# Patient Record
Sex: Female | Born: 1960 | Race: White | Hispanic: No | Marital: Married | Smoking: Never smoker
Health system: Southern US, Community
[De-identification: ages and names within clinical notes are randomized; demographics above are authoritative.]

## PROBLEM LIST (undated history)

## (undated) DIAGNOSIS — E079 Disorder of thyroid, unspecified: Secondary | ICD-10-CM

---

## 2001-09-10 ENCOUNTER — Inpatient Hospital Stay (HOSPITAL_COMMUNITY): Admission: AD | Admit: 2001-09-10 | Discharge: 2001-09-14 | Payer: Self-pay | Admitting: Obstetrics and Gynecology

## 2001-10-13 ENCOUNTER — Other Ambulatory Visit: Admission: RE | Admit: 2001-10-13 | Discharge: 2001-10-13 | Payer: Self-pay | Admitting: Obstetrics and Gynecology

## 2004-01-30 ENCOUNTER — Other Ambulatory Visit: Admission: RE | Admit: 2004-01-30 | Discharge: 2004-01-30 | Payer: Self-pay | Admitting: Obstetrics and Gynecology

## 2007-07-16 ENCOUNTER — Emergency Department (HOSPITAL_COMMUNITY): Admission: EM | Admit: 2007-07-16 | Discharge: 2007-07-16 | Payer: Self-pay | Admitting: Emergency Medicine

## 2009-09-12 ENCOUNTER — Other Ambulatory Visit: Admission: RE | Admit: 2009-09-12 | Discharge: 2009-09-12 | Payer: Self-pay | Admitting: Family Medicine

## 2010-05-09 ENCOUNTER — Ambulatory Visit (HOSPITAL_COMMUNITY): Admission: RE | Admit: 2010-05-09 | Discharge: 2010-05-09 | Payer: Self-pay | Admitting: Internal Medicine

## 2010-05-09 IMAGING — US US SOFT TISSUE HEAD/NECK
1 series · 14 of 25 positions shown · non-contrast
Comparison: None.

CLINICAL DATA: Chronic lymphocytic thyroiditis

THYROID ULTRASOUND
TECHNIQUE: Ultrasound examination of the thyroid gland and adjacent
soft tissues was performed.

[Series 1: us soft tissue head/neck · 0.08mm/px · 14 of 29 slices shown]
[im 1/29]
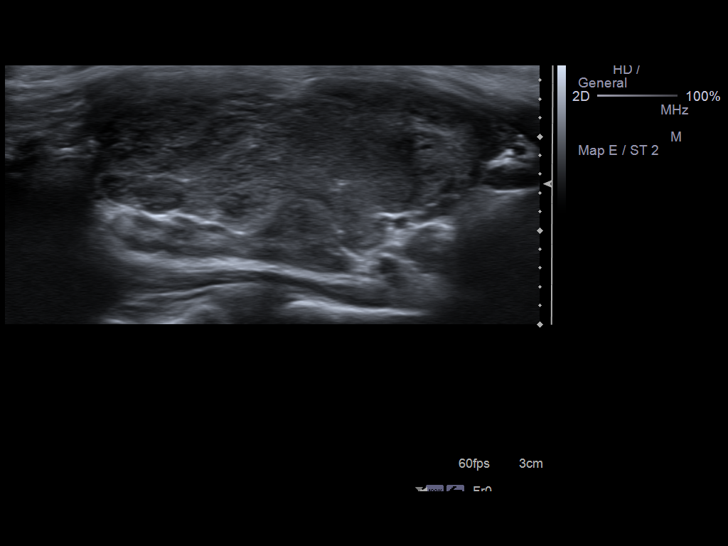
[im 3/29]
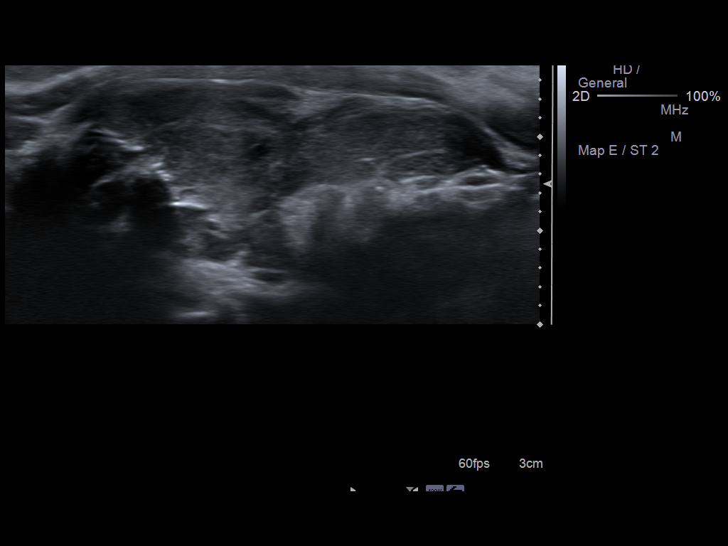
[im 5/29]
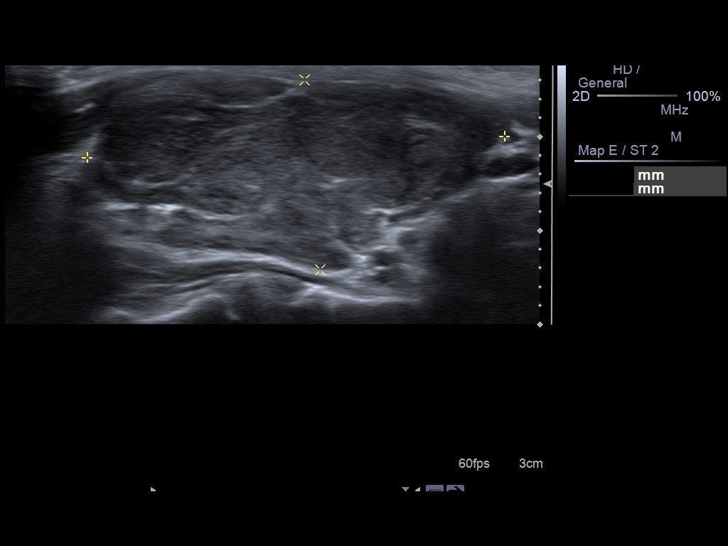
[im 8/29]
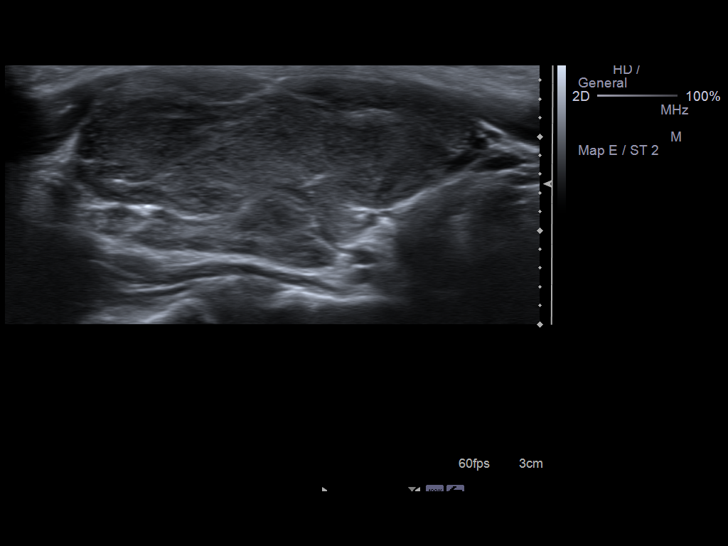
[im 10/29]
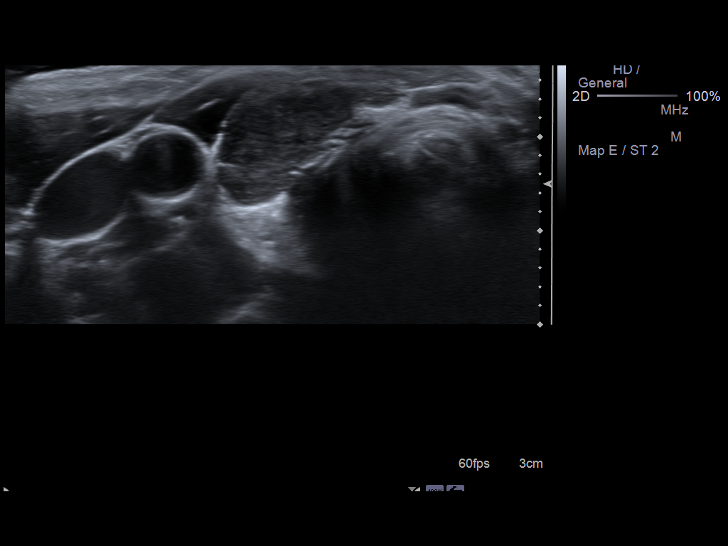
[im 11/29]
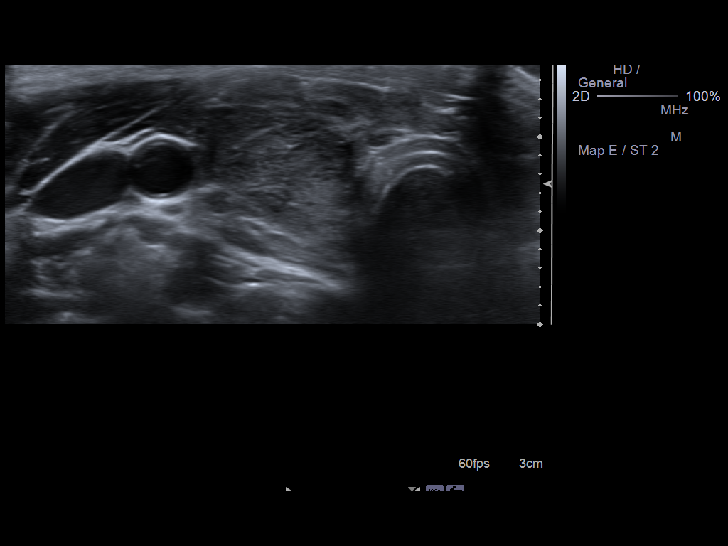
[im 13/29]
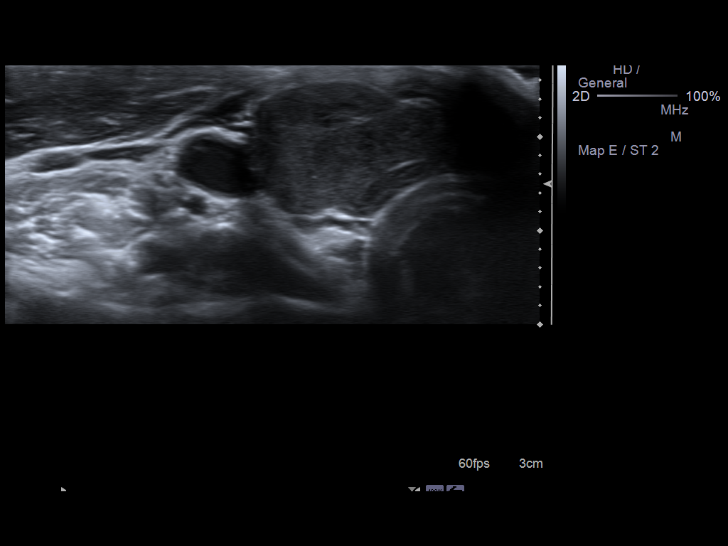
[im 16/29]
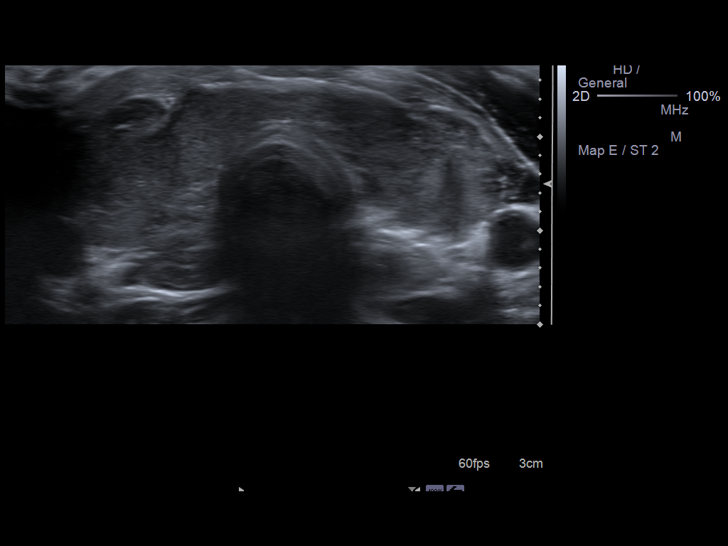
[im 18/29]
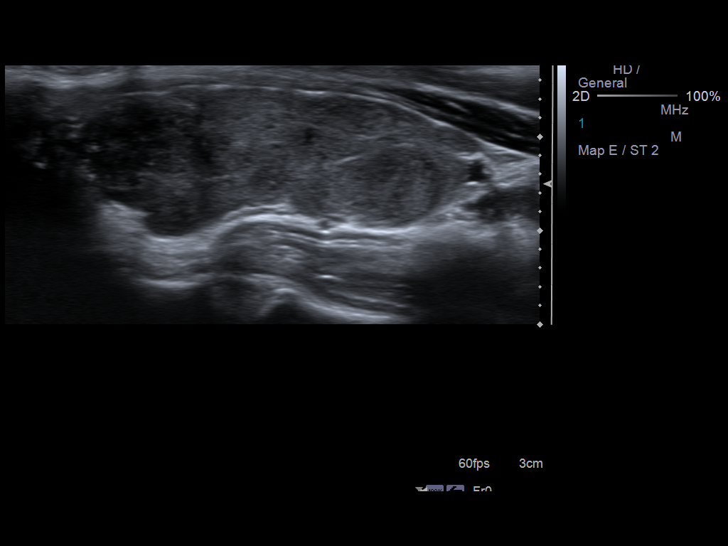
[im 19/29]
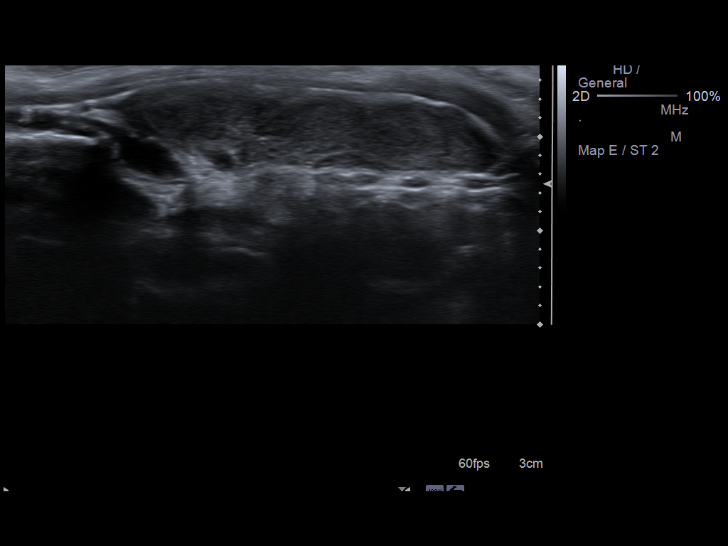
[im 22/29]
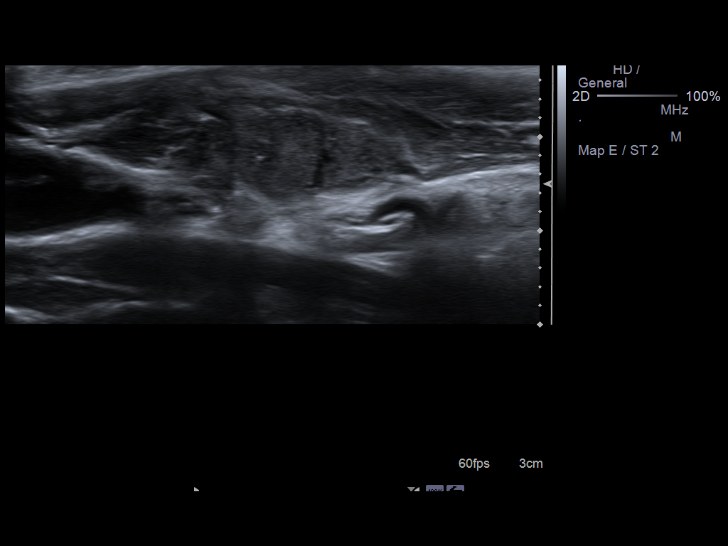
[im 24/29]
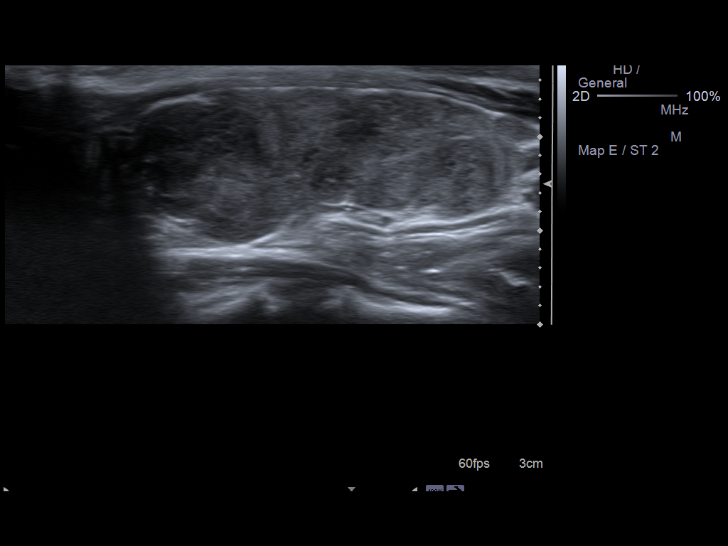
[im 26/29]
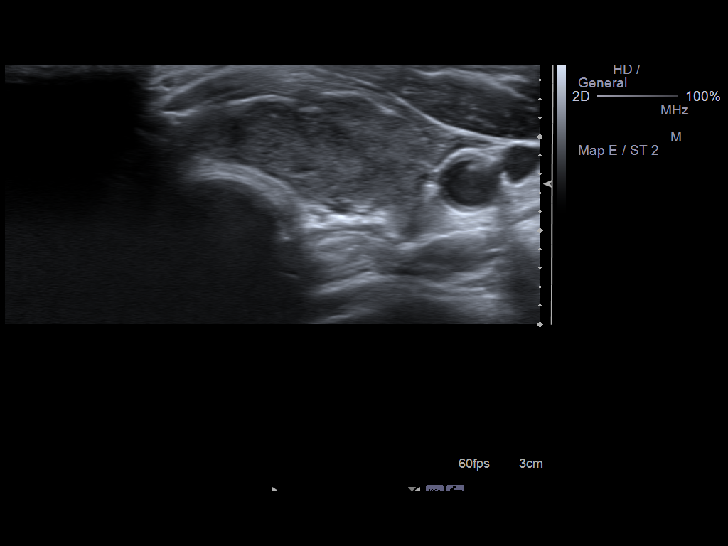
[im 29/29]
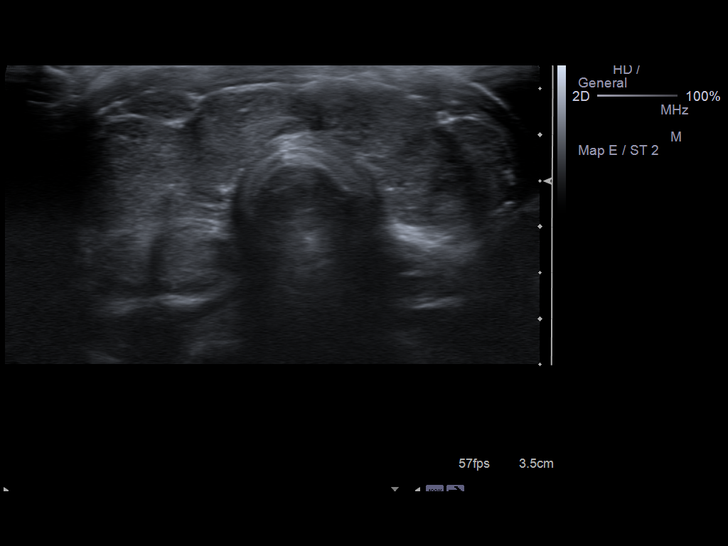

[14 of 25 positions shown; findings below may reference images not displayed]

FINDINGS: Right thyroid lobe:  19 x 20 x 45 mm, lobular and inhomogeneous in
echotexture without dominant lesion or nodule.
Left thyroid lobe:  15 x 17 x 42 mm, similarly lobular and
inhomogeneous in echotexture without discrete nodule or mass.
Isthmus:  4 mm thickness, unremarkable

Focal nodules:  No dominant nodule or mass

Lymphadenopathy:  None identified
IMPRESSION: Inhomogeneous thyroid echotexture without discrete mass or nodule.

## 2011-02-15 NOTE — Op Note (Signed)
[facility]  Patient:    [name], [name] Visit Number: [id number] MRN: [id number]          Service Type: OBS Location: 910B [id number] 01 Attending Physician:  Genia Del Dictated by:   Silverio Lay, M.D. Proc. Date: 09/11/01 Admit Date:  09/10/2001                             Operative Report  PREOPERATIVE DIAGNOSES:       Intrauterine pregnancy at 41 weeks, failure to progress.  POSTOPERATIVE DIAGNOSES:      Intrauterine pregnancy at 41 weeks, failure to progress.  ANESTHESIA:                   Epidural.  PROCEDURE:                    Primary low transverse cesarean section.  SURGEON:                      Silverio Lay, M.D.  ASSISTANT:                    ______  PROCEDURE:                    After being informed of the planned procedure with possible complications including bleeding, infection, injury to bowels, bladder, and ureters informed consent was obtained.  Patient was taken to OR #1 and preexisting epidural anesthesia was reinforced.  She was placed in a dorsal decubitus position with pelvis tilted to the left, prepped and draped in a sterile fashion and a Foley catheter was already in her bladder.  After assessing adequate level of anesthesia we infiltrate skin and subcutaneous tissue using 10 cc of Marcaine 0.25.  We then proceed with incision of skin, subcutaneous tissue, and fat in a low transverse fashion.  Fascia is incised in a low transverse fashion.  ______ is dissected and peritoneum is entered in the midline fashion.  Visceroperitoneum is entered in the low transverse fashion allowing [place] to develop bladder flap and to safely retract bladder. Myometrium is then entered in a low transverse fashion first with ______ bluntly.  Amniotic fluid is clear.  We assist the birth of a female infant at 9:18 p.m. in a left transverse position with a shoulder cord loop.  Mouth and nose are suctioned.  Cord is clamped with two  Kelly clamps and sectioned and baby is given to the pediatrician present in the room.  We then draw an arterial cord blood gas as well as 20 cc of venous blood.  Placenta is expelled spontaneously.  Uterine revision is negative.  We then proceed with closure of myometrium in two layers, first with a running locked suture of 0 Vicryl, then with a Lembert suture of 0 Vicryl covering the first one. Hemostasis is assessed and adequate.  Both pericolic gutters are cleansed. Both tubes and ovaries assessed and normal.  Hemostasis is reassessed and adequate.  Under fascia hemostasis is completed with cautery and fascia is closed with two running sutures of 0 Vicryl meeting midline.  Wound is then irrigated with warm saline.  Hemostasis is completed with cautery and skin is closed with staples.  Instruments and sponge count is complete x 2.  Estimated blood loss is 600 cc.  Baby received an Apgar of 8 at one minute and 9 at five minutes.  Weight 7  pounds 8 ounces.  Cord pH was 7.26.  Procedure is well tolerated by the patient who is taken to recovery room in a well and stable condition. Dictated by:   Silverio Lay, M.D. Attending Physician:  Genia Del DD:  09/11/01 TD:  09/12/01 Job: [id number]

## 2011-02-15 NOTE — H&P (Signed)
[facility] of [facility]  Patient:    [name], [name] Visit Number: [id number] MRN: [id number]          Service Type: Attending:  Silverio Lay, M.D. Dictated by:   Silverio Lay, M.D. Adm. Date:  09/10/01                           History and Physical  DATE OF BIRTH:                1961-01-19  Patient will be admitted tonight on September 10, 2001 to undergo cervical ripening.  REASON FOR ADMISSION:         Intrauterine pregnancy at 40 weeks and 6 days, elective induction of labor for postdates.  HISTORY OF PRESENT ILLNESS:   This is a 50 year old married white female, gravida 3, para 0, abortus 2, with a due date by ultrasound of September 04, 2001, who is being admitted today for elective induction of labor for postdates and is going to undergo cervical ripening with Cytotec.  She was last seen in the office on September 08, 2001, reporting good fetal activity, denying any contractions, denying any bleeding or watery discharge and denying any symptoms of pregnancy-induced hypertension.  Her ultrasound that day revealed an estimated fetal weight of 7 pound 11 ounces, or 31st percentile, amniotic fluid index at 13.1, or 69th percentile, and biophysical profile 8/8. Her vaginal exam was a right 1 cm, 60% effaced, vertex -2.  Her prenatal course reveals blood type O-positive, toxoplasmosis negative, RPR nonreactive, rubella immune, HBsAg negative, HIV nonreactive, hepatitis C antibody negative, Pap smear within normal limits, gonorrhea negative, Chlamydia negative.  First trimester ultrasound changed due date to September 04, 2001, with a normal nuchal translucency.  Amniocentesis was performed under ultrasound guidance at 16 weeks and revealed a normal karyotype; patient does not want to know sex of the baby.  Twenty-week ultrasound revealed a normal anatomy survey, posterior placenta, normal cervical length.  One-hour glucose tolerance test at 28 weeks  was 98, well within normal limits.  Thirty-five-week group B strep was negative.  Her prenatal course was otherwise complicated by refractory carpal tunnel symptoms, which patient has been using splints bilaterally.  ALLERGIES:                    No known drug allergy.  PAST MEDICAL HISTORY:         TAB at 10 weeks in 1984, no complication; SAB at 10 weeks in 1986, no complication.  FAMILY HISTORY:               Father with diabetes and myocardial infarction as well as stroke.  SOCIAL HISTORY:               Married, nonsmoker, is an Chief of Staff.  PHYSICAL EXAMINATION:  GENERAL:                      Current weight is 139 pounds for a 30-pound weight gain.  VITAL SIGNS:                  Blood pressure 90/64.  HEENT:                        Negative.  LUNGS:                        Clear.  HEART:  Normal.  ABDOMEN:                      Gravid, nontender, fundal height at 38 cm, vertex presentation.  EXTREMITIES:                  Negative.  ASSESSMENT:                   Intrauterine pregnancy at 40 weeks and 6 days, to undergo elective induction of labor.  PLAN:                         Patient is admitted on September 10, 2001 to undergo cervical ripening with Cytotec and induction with Pitocin augmentation the next morning.  Spontaneous vaginal delivery is expected.  Babys name will be [name] or [name], depending on gender. Dictated by:   Silverio Lay, M.D. Attending:  Silverio Lay, M.D. DD:  09/10/01 TD:  09/10/01 Job: [id number]

## 2011-02-15 NOTE — Discharge Summary (Signed)
[facility] of [facility]  Patient:    [name], [name] Visit Number: [id number] MRN: [id number]          Service Type: OBS Location: 910A 9131 01 Attending Physician:  Genia Del Dictated by:   Silverio Lay, M.D. Admit Date:  09/10/2001 Discharge Date: 09/14/2001                             Discharge Summary  REASON FOR ADMISSION:         Intrauterine pregnancy at 40-6/7 weeks, to undergo elective induction of labor for postdates.  HISTOR OF PRESENT ILLNESS/ HOSPITAL COURSE:              This is a 50 year old married white female, gravida 3, para 0, with a due date by ultrasound of September 04, 2001, who is being admitted today for elective induction of labor for postdates and is going to undergo cervical ripening with Cytotec.  Her last visit in the office two days prior to admission, was essentially normal.  An ultrasound performed on that day revealed an average for gestational age at 7 pounds and 11 ounces with a normal amniotic fluid index and biophysical profile of 8 on 8.  On September 11, 2001, after having received two doses of Cytotec, Pitocin was started and her vaginal exam was 1 cm/80% effaced/vertex/-1 and we performed artificial rupture of membranes at 7:30 in the morning, which revealed clear fluid.  Pitocin was progressively augmented and labor was initially clinically evaluated, then monitored with an intrauterine pressure catheter to reveal adequate Monte[place] units.  At 2:30 p.m., her vaginal exam was 5 cm with anterior lip edema, vertex at -1 to 0.  At 6:30 p.m., four hours later, her vaginal exam was 5 cm with increased edema, vertex presentation at 0 station, left occiput anterior.  The patient was now also diagnosed with maternal fever at 101.1.  Unasyn was started.  Tylenol was given to the patient.  Fetal monitoring was always reassuring and at that point, discussion took place with patient and husband regarding the poor  progress of labor, with a possible need for cesarean section, but patient requested to continue and reevaluate in two hours.  Two hours later, with Pitocin at 11 mU/minute and [place] units between 250 and 270, vaginal exam remained unchanged, with now possible posterior asynclitism.  Maternal temperature was now reduced at 99 and fetal heart rate tracings were still reactive.  Because of failure to progress, decision was made to perform a primary low transverse cesarean section, after reviewing with patient and husband the procedure and possible complications, including bleeding, infection, injury to bowels, bladder and ureters, as well as hospital stay.  The patient underwent a primary low transverse cesarean section with the birth of a female infant at 9:18 p.m.  Apgars at 8 and 9, weighing 7 pounds 8 ounces with a cord pH of 7.26.  The procedure went without complication and Estimated blood loss was 600 cc.  Postoperative course remained uneventful and the patient remained afebrile. Her postoperative hemoglobin was 11.6.  She was discharged home on postoperative day #3 in a good and stable condition.  She was instructed to call if increased pain, increased bleeding or fever.  She was given a prescription of Motrin 600 mg and Tylox for pain management and was instructed to come back in the office in 4-6 weeks for her postoperative appointment.  FINAL DIAGNOSIS:  Intrauterine pregnancy at 41 weeks, failure to progress, status post primary low transverse cesarean section.  CONDITION AT DISCHARGE:       Well and stable. Dictated by:   Silverio Lay, M.D. Attending Physician:  Genia Del DD:  10/23/01 TD:  10/25/01 Job: [id number]

## 2012-07-31 ENCOUNTER — Ambulatory Visit
Admission: RE | Admit: 2012-07-31 | Discharge: 2012-07-31 | Disposition: A | Discharge status: Home / Self Care | Payer: No Typology Code available for payment source | Source: Ambulatory Visit | Attending: Infectious Diseases | Admitting: Infectious Diseases

## 2012-07-31 ENCOUNTER — Other Ambulatory Visit: Payer: Self-pay | Admitting: Infectious Diseases

## 2012-07-31 DIAGNOSIS — R7612 Nonspecific reaction to cell mediated immunity measurement of gamma interferon antigen response without active tuberculosis: Secondary | ICD-10-CM

## 2015-04-06 ENCOUNTER — Emergency Department
Admission: EM | Admit: 2015-04-06 | Discharge: 2015-04-06 | Disposition: A | Discharge status: Home / Self Care | Payer: 59 | Source: Home / Self Care | Attending: Family Medicine | Admitting: Family Medicine

## 2015-04-06 ENCOUNTER — Encounter: Payer: Self-pay | Admitting: *Deleted

## 2015-04-06 DIAGNOSIS — M549 Dorsalgia, unspecified: Secondary | ICD-10-CM | POA: Diagnosis not present

## 2015-04-06 DIAGNOSIS — B029 Zoster without complications: Secondary | ICD-10-CM | POA: Diagnosis not present

## 2015-04-06 HISTORY — DX: Disorder of thyroid, unspecified: E07.9

## 2015-04-06 MED ORDER — VALACYCLOVIR HCL 1 G PO TABS: 1000.0000 mg | ORAL_TABLET | Freq: Three times a day (TID) | ORAL | Status: AC

## 2015-04-06 MED ORDER — HYDROCODONE-ACETAMINOPHEN 5-325 MG PO TABS: 1.0000 | ORAL_TABLET | Freq: Four times a day (QID) | ORAL | Status: DC | PRN

## 2015-04-06 MED ORDER — GABAPENTIN 100 MG PO CAPS: 100.0000 mg | ORAL_CAPSULE | Freq: Three times a day (TID) | ORAL | Status: DC | PRN

## 2015-04-06 NOTE — ED Notes (Signed)
Pt c/o lower back pain without injury x 6 days. Developed rash @ pain site yesterday.

## 2015-04-06 NOTE — Discharge Instructions (Signed)
Please take your medication as prescribed.  Norco/vicodin is a narcotic pain medication, do not combine these medications with others containing tylenol. While taking, do not drink alcohol, drive, or perform any other activities that requires focus while taking these medications.  Follow up with your Primary Care provider in 1 week if symptoms not improving, sooner if worsening. See below for further instructions.

## 2015-04-06 NOTE — ED Provider Notes (Signed)
CSN: [id number]     Arrival date & time 04/06/15  6962 History   First MD Initiated Contact with Patient 04/06/15 [phone]     Chief Complaint  Patient presents with  . Back Pain  . Rash   (Consider location/radiation/quality/duration/timing/severity/associated sxs/prior Treatment) HPI  Patient is a 54 year old female presenting to urgent care with a six-day history of lower back pain on right side. Patient reports developing a rash yesterday along the mid right side of her back. Pain is cramping and aching in severity with occasional burning sensation. Pain is 5 out of 10 at worst. It does improve with ibuprofen but then comes back. Patient does report mild nausea but no fevers, chills, vomiting, or diarrhea. Denies sick contacts or recent travel. Denies known tick bites. Denies chest pain or difficulty breathing. Patient does state she had chickenpox when she was 54 years old.  Past Medical History  Diagnosis Date  . Thyroid disease    Past Surgical History  Procedure Laterality Date  . Cesarean section     Family History  Problem Relation Age of Onset  . Arthritis Mother   . Peripheral vascular disease Father   . Stroke Father   . Heart attack Father    History  Substance Use Topics  . Smoking status: Never Smoker   . Smokeless tobacco: Never Used  . Alcohol Use: Yes   OB History    No data available     Review of Systems  Constitutional: Positive for activity change and fatigue. Negative for fever, chills and diaphoresis.  HENT: Negative for congestion, sore throat, trouble swallowing and voice change.   Respiratory: Negative for cough and shortness of breath.   Gastrointestinal: Positive for nausea. Negative for vomiting, abdominal pain and diarrhea.  Musculoskeletal: Positive for myalgias and back pain. Negative for arthralgias.  Skin: Positive for rash. Negative for color change and wound.  Neurological: Negative for weakness and numbness.    Allergies   Thimerosal  Home Medications   Prior to Admission medications   Medication Sig Start Date End Date Taking? Authorizing Provider  levothyroxine (SYNTHROID, LEVOTHROID) 100 MCG tablet Take 100 mcg by mouth daily before breakfast.   Yes Historical Provider, MD  gabapentin (NEURONTIN) 100 MG capsule Take 1 capsule (100 mg total) by mouth 3 (three) times daily as needed. 04/06/15   Noland Fordyce, PA-C  HYDROcodone-acetaminophen (NORCO/VICODIN) 5-325 MG per tablet Take 1 tablet by mouth every 6 (six) hours as needed. 04/06/15   Noland Fordyce, PA-C  valACYclovir (VALTREX) 1000 MG tablet Take 1 tablet (1,000 mg total) by mouth 3 (three) times daily. For 7 days 04/06/15 04/20/15  Noland Fordyce, PA-C   BP 101/67 mmHg  Pulse 73  Temp(Src) 98.3 F (36.8 C) (Oral)  Resp 16  Ht 5' (1.524 m)  Wt 114 lb (51.71 kg)  BMI 22.26 kg/m2  SpO2 100% Physical Exam  Constitutional: She is oriented to person, place, and time. She appears well-developed and well-nourished.  Pt sitting on exam bed, appears mildly fatigued, but non-toxic appearing.  HENT:  Head: Normocephalic and atraumatic.  Eyes: EOM are normal.  Neck: Normal range of motion. Neck supple.  Cardiovascular: Normal rate.   Pulmonary/Chest: Effort normal.  Musculoskeletal: Normal range of motion. She exhibits tenderness. She exhibits no edema.  Lower thoracic tenderness with Right side thoracic muscle tenderness. No step-offs or crepitus. FROM upper and lower extremities bilaterally. (see skin exam)  Neurological: She is alert and oriented to person, place, and time.  Skin:  Skin is warm and dry. Rash noted. There is erythema.  Small area of erythematous papular rash with mild excoriation. No vesicles noted. Rash runs along lower thoracic spine and radiates minimally to the Right.   Scarce erythematous papules on left side, not c/w herpes zoster.   Psychiatric: She has a normal mood and affect. Her behavior is normal.  Nursing note and vitals  reviewed.   ED Course  Procedures (including critical care time) Labs Review Labs Reviewed - No data to display  Imaging Review No results found.   MDM   1. Mid back pain on right side   2. Shingles rash     Patient complaining of right mid to lower back pain for one week then developed a rash yesterday. Rash is not 100% characteristic of shingles however due to presentation of rash after one week of associated back pain will treat as such.  Discussed risk and benefits of antiviral medications as well as gabapentin and Vicodin for severe breakthrough pain. Home care instructions provided. Follow-up with PCP in one week if not improving. Return precautions provided. Pt verbalized understanding and agreement with tx plan.     Noland Fordyce, PA-C 04/06/15 1134

## 2016-01-22 DIAGNOSIS — E039 Hypothyroidism, unspecified: Secondary | ICD-10-CM | POA: Diagnosis not present

## 2016-01-29 DIAGNOSIS — E063 Autoimmune thyroiditis: Secondary | ICD-10-CM | POA: Diagnosis not present

## 2016-01-29 DIAGNOSIS — E039 Hypothyroidism, unspecified: Secondary | ICD-10-CM | POA: Diagnosis not present

## 2016-11-04 ENCOUNTER — Other Ambulatory Visit (HOSPITAL_COMMUNITY)
Admission: RE | Admit: 2016-11-04 | Discharge: 2016-11-04 | Disposition: A | Discharge status: Home / Self Care | Payer: 59 | Source: Ambulatory Visit | Attending: Obstetrics & Gynecology | Admitting: Obstetrics & Gynecology

## 2016-11-04 ENCOUNTER — Other Ambulatory Visit: Payer: Self-pay | Admitting: Obstetrics & Gynecology

## 2016-11-04 DIAGNOSIS — Z1151 Encounter for screening for human papillomavirus (HPV): Secondary | ICD-10-CM | POA: Diagnosis not present

## 2016-11-04 DIAGNOSIS — Z01419 Encounter for gynecological examination (general) (routine) without abnormal findings: Secondary | ICD-10-CM | POA: Diagnosis not present

## 2016-11-05 LAB — CYTOLOGY - PAP
ADEQUACY: ABSENT
DIAGNOSIS: NEGATIVE
HPV (WINDOPATH): NOT DETECTED

## 2017-02-03 DIAGNOSIS — E039 Hypothyroidism, unspecified: Secondary | ICD-10-CM | POA: Diagnosis not present

## 2017-02-03 DIAGNOSIS — E063 Autoimmune thyroiditis: Secondary | ICD-10-CM | POA: Diagnosis not present

## 2017-06-09 DIAGNOSIS — H5213 Myopia, bilateral: Secondary | ICD-10-CM | POA: Diagnosis not present

## 2017-06-19 ENCOUNTER — Other Ambulatory Visit (HOSPITAL_COMMUNITY): Payer: Self-pay | Admitting: Neurosurgery

## 2017-06-19 DIAGNOSIS — G5602 Carpal tunnel syndrome, left upper limb: Secondary | ICD-10-CM | POA: Diagnosis not present

## 2017-06-19 DIAGNOSIS — M50222 Other cervical disc displacement at C5-C6 level: Secondary | ICD-10-CM | POA: Diagnosis not present

## 2017-06-19 DIAGNOSIS — R202 Paresthesia of skin: Secondary | ICD-10-CM | POA: Diagnosis not present

## 2017-06-19 DIAGNOSIS — M542 Cervicalgia: Secondary | ICD-10-CM | POA: Diagnosis not present

## 2017-06-19 DIAGNOSIS — R200 Anesthesia of skin: Secondary | ICD-10-CM | POA: Diagnosis not present

## 2017-06-19 DIAGNOSIS — M5412 Radiculopathy, cervical region: Secondary | ICD-10-CM | POA: Diagnosis not present

## 2017-07-08 ENCOUNTER — Ambulatory Visit (HOSPITAL_COMMUNITY)
Admission: RE | Admit: 2017-07-08 | Discharge: 2017-07-08 | Disposition: A | Discharge status: Home / Self Care | Payer: 59 | Source: Ambulatory Visit | Attending: Neurosurgery | Admitting: Neurosurgery

## 2017-07-08 DIAGNOSIS — M47812 Spondylosis without myelopathy or radiculopathy, cervical region: Secondary | ICD-10-CM | POA: Insufficient documentation

## 2017-07-08 DIAGNOSIS — M50223 Other cervical disc displacement at C6-C7 level: Secondary | ICD-10-CM | POA: Diagnosis not present

## 2017-07-08 DIAGNOSIS — M50222 Other cervical disc displacement at C5-C6 level: Secondary | ICD-10-CM

## 2017-07-08 DIAGNOSIS — M4802 Spinal stenosis, cervical region: Secondary | ICD-10-CM | POA: Insufficient documentation

## 2017-07-08 DIAGNOSIS — M2578 Osteophyte, vertebrae: Secondary | ICD-10-CM | POA: Insufficient documentation

## 2017-07-08 DIAGNOSIS — M50221 Other cervical disc displacement at C4-C5 level: Secondary | ICD-10-CM | POA: Diagnosis not present

## 2017-07-09 DIAGNOSIS — M5412 Radiculopathy, cervical region: Secondary | ICD-10-CM | POA: Diagnosis not present

## 2017-07-09 DIAGNOSIS — M50222 Other cervical disc displacement at C5-C6 level: Secondary | ICD-10-CM | POA: Diagnosis not present

## 2017-07-09 DIAGNOSIS — M542 Cervicalgia: Secondary | ICD-10-CM | POA: Diagnosis not present

## 2017-07-21 ENCOUNTER — Ambulatory Visit: Payer: 59 | Attending: Neurosurgery | Admitting: Physical Therapy

## 2017-07-21 ENCOUNTER — Encounter: Payer: Self-pay | Admitting: Physical Therapy

## 2017-07-21 DIAGNOSIS — M5412 Radiculopathy, cervical region: Secondary | ICD-10-CM | POA: Insufficient documentation

## 2017-07-21 DIAGNOSIS — R208 Other disturbances of skin sensation: Secondary | ICD-10-CM | POA: Diagnosis not present

## 2017-07-21 DIAGNOSIS — M542 Cervicalgia: Secondary | ICD-10-CM | POA: Insufficient documentation

## 2017-07-21 NOTE — Therapy (Signed)
[place], [place], [address] Phone: [phone]   Fax:  [phone]  Physical Therapy Evaluation  Patient Details  Name: [name] MRN: [id number] Date of Birth: June 12, 1961 Referring Provider: Dr. Erline Levine   Encounter Date: 07/21/2017      PT End of Session - 07/21/17 2129    Visit Number 1   Number of Visits 12   Date for PT Re-Evaluation 09/01/17   PT Start Time 0934   PT Stop Time 1020   PT Time Calculation (min) 46 min   Activity Tolerance Patient tolerated treatment well   Behavior During Therapy [facility] for tasks assessed/performed      Past Medical History:  Diagnosis Date  . Thyroid disease     Past Surgical History:  Procedure Laterality Date  . CESAREAN SECTION      There were no vitals filed for this visit.       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  She felt like it was a pulled muscle on L side for a few weeks  She noticed pins and needles in her 2-3 digits and ventral surface of forearm.  arm feels heavy.  She continued to work.  Her pain has prevented her from working with her L Arm.  She has had to cut back on gardening, lifting large bags and physical activity in general.    Pertinent History none   Limitations Sitting;Reading;Lifting;House hold activities;Other (comment)  belly sleeping   Diagnostic tests MRI done 06/19/17 confirmed C5-C6 spondylosis and C7 disc herniation , XR not available   Patient Stated Goals Pt would like to avoid surgery.    Currently in Pain? Yes   Pain Score 4    Pain Location Scapula   Pain Orientation Left   Pain Descriptors / Indicators Tightness;Aching;Tingling   Pain Type Chronic pain   Pain Radiating Towards tingling in L UE, fingers    Pain Onset More than a month ago   Pain Frequency Intermittent   Aggravating Factors  leaning forward,  position of neck    Pain Relieving Factors leaning back into chair, heat, has not tried a massage   Effect of Pain on Daily Activities limits her at work    Multiple Pain Sites No            [facility] PT Assessment - 07/21/17 0001      Assessment   Medical Diagnosis Herniated cervical disc   Referring Provider Dr. Erline Levine    Onset Date/Surgical Date 04/29/17   Hand Dominance Right   Prior Therapy no     Precautions   Precautions None     Balance Screen   Has the patient fallen in the past 6 months No     Prior Function   Level of Independence Independent   Vocation Part time employment   Vocation Requirements RT (interventional radiology)    Leisure Gardening,exercising, hiking      Observation/Other Assessments   Focus on Therapeutic Outcomes (FOTO)  41%     Sensation   Light Touch Impaired by gross assessment   Additional Comments pins and needles in L UE forearm and fingers      Posture/Postural Control   Posture/Postural Control No significant limitations     AROM   Cervical Flexion 55   Cervical Extension 50  pain L  Cervical - Right Side Bend 45   Cervical - Left Side Bend 45   Cervical - Right Rotation 57   Cervical - Left Rotation 60     PROM   Overall PROM Comments full PROM in lateral flexion and rotation in supine      Strength   Overall Strength Comments L triceps 4/5    Strength Assessment Site --  WFL      Palpation   Spinal mobility normal   Palpation comment TTP Lt levator scap and rhomboids   increasd referral of sx/pain in L hand     Special Tests    Special Tests Cervical   Cervical Tests other     Distraction Test   Findngs Positive   side Left   Comment resolved sx LUE in supine        Objective measurements completed on examination: See above findings.          [place] Adult PT Treatment/Exercise - 07/21/17 0001      Self-Care   Lifting stabilizing neck    Other Self-Care Comments  HEP, see self care       Traction   Type of Traction Cervical   Min (lbs) 6   Max (lbs) 12   Hold Time 60   Rest Time 15   Time 15     Neck Exercises: Stretches   Upper Trapezius Stretch 2 reps;30 seconds   Levator Stretch 3 reps;30 seconds                PT Education - 07/21/17 2129    Education provided Yes   Education Details PT/POC, HEP, posture and traction , Trigger point dry needling   Person(s) Educated Patient   Methods Explanation;Demonstration;Tactile cues;Verbal cues;Handout   Comprehension Verbalized understanding;Returned demonstration;Need further instruction             PT Long Term Goals - 07/21/17 2130      PT LONG TERM GOAL #1   Title Pt will be I with HEP for posture, cervical stabilization.    Time 6   Period Weeks   Status New   Target Date 09/01/17     PT LONG TERM GOAL #2   Title Pt will score <30% on FOTO to demo functional improvement    Time 6   Period Weeks   Status New   Target Date 09/01/17     PT LONG TERM GOAL #3   Title Pt will be able to report min to no symptoms in LUE with neck ROM, mobility.    Time 6   Period Weeks   Status New   Target Date 09/01/17     PT LONG TERM GOAL #4   Title Pt will be able to lift, use LT UE for work related tasks with confidence and min to no UE sx, neck pain.    Time 6   Period Weeks   Status New   Target Date 09/01/17     PT LONG TERM GOAL #5   Title Pt will be able to sit without back support for short periods <30 min and be comfortable for meals, conversation.    Time 6   Period Weeks   Status New   Target Date 09/01/17                Plan - 07/21/17 2133    Clinical Impression Statement Patient presents with known disc pathology in C5-C6-C7 which she may have sustained while working.  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  She demonstrated L UE weakness (min) and increased sensory sx with cervical ROM and positioning. She responded well to traction, she should do  well with therapy to reduce spinal loads and education on proper lifting, body mechanics.     Clinical Presentation Stable   Clinical Decision Making Low   Rehab Potential Excellent   PT Frequency 2x / week   PT Duration 6 weeks   PT Treatment/Interventions ADLs/Self Care Home Management;Dry needling;Taping;Functional mobility training;Cryotherapy;Electrical Stimulation;Moist Heat;Traction;Ultrasound;Neuromuscular re-education;Passive range of motion;Therapeutic exercise;Manual techniques;Therapeutic activities;Patient/family education   PT Next Visit Plan assess traction, repeat, check HEP, posture and begin stab. C spine    PT Home Exercise Plan neck tension stretches, chin tuck   Consulted and Agree with Plan of Care Patient      Patient will benefit from skilled therapeutic intervention in order to improve the following deficits and impairments:  Impaired UE functional use, Pain, Increased fascial restricitons, Decreased strength, Decreased mobility, Improper body mechanics, Decreased range of motion, Impaired sensation  Visit Diagnosis: Cervicalgia  Radiculopathy, cervical region  Other disturbances of skin sensation     Problem List There are no active problems to display for this patient.   PAA,JENNIFER 07/21/2017, 9:43 PM  [facility] [address] [place], [place], [address] Phone: [phone]   Fax:  [phone]  Name: [name] MRN: [id number] Date of Birth: 04/29/1961   Raeford Razor, PT 07/21/17 9:43 PM Phone: [phone] Fax: [phone]

## 2017-07-21 NOTE — Patient Instructions (Addendum)
  Levator Stretch   Grasp seat or sit on hand on side to be stretched. Turn head toward other side and look down. Use hand on head to gently stretch neck in that position. Hold _30___ seconds. Repeat on other side. Repeat ___3_ times. Do __2__ sessions per day.  http://gt2.exer.us/30   Copyright  VHI. All rights reserved.  Side-Bending   One hand on opposite side of head, pull head to side as far as is comfortable. Stop if there is pain. Hold __30__ seconds. Repeat with other hand to other side. Repeat __3__ times. Do ___2_ sessions per day.   Copyright  VHI. All rights reserved.  Scapular Retraction (Standing)   With arms at sides, pinch shoulder blades together. Repeat __10__ times per set. Do __1__ sets per session. Do 2____ sessions per day.  http://orth.exer.us/944    Axial Extension (Chin Tuck)    Pull chin in and lengthen back of neck. Hold _5___ seconds while counting out loud. Repeat __10__ times. Do _2_ sessions per day.  http://gt2.exer.us/450   Copyright  VHI. All rights reserved.

## 2017-07-28 ENCOUNTER — Ambulatory Visit: Payer: 59 | Admitting: Physical Therapy

## 2017-07-28 DIAGNOSIS — M5412 Radiculopathy, cervical region: Secondary | ICD-10-CM

## 2017-07-28 DIAGNOSIS — R208 Other disturbances of skin sensation: Secondary | ICD-10-CM

## 2017-07-28 DIAGNOSIS — M542 Cervicalgia: Secondary | ICD-10-CM | POA: Diagnosis not present

## 2017-07-28 NOTE — Therapy (Signed)
[place], [place], [address] Phone: [phone]   Fax:  [phone]  Physical Therapy Treatment  Patient Details  Name: [name] MRN: [id number] Date of Birth: 05/13/1961 Referring Provider: Dr. Erline Levine   Encounter Date: 07/28/2017      PT End of Session - 07/28/17 1408    Visit Number 2   Number of Visits 12   Date for PT Re-Evaluation 09/01/17   PT Start Time 1330   PT Stop Time 1430   PT Time Calculation (min) 60 min   Activity Tolerance Patient tolerated treatment well   Behavior During Therapy [facility] for tasks assessed/performed      Past Medical History:  Diagnosis Date  . Thyroid disease     Past Surgical History:  Procedure Laterality Date  . CESAREAN SECTION      There were no vitals filed for this visit.      Subjective Assessment - 07/28/17 1335    Subjective Had had intermittent episodes of numbness in L lateral digits (3-5).  Was a bit sore in neck after traction.     Currently in Pain? No/denies             [facility] Adult PT Treatment/Exercise - 07/28/17 0001      Neck Exercises: Supine   Neck Retraction 5 reps;5 secs   Neck Retraction Limitations 30 sec DNF lift off table strain, loss of tuck about 20 sec    Other Supine Exercise supine scapular stabilization green band x 10 : overhead lift, horiz abd and diagonal , used ball under neck for challenge     Shoulder Exercises: ROM/Strengthening   UBE (Upper Arm Bike) 6 min total, 3 min forward, 3 min back level 1      Traction   Type of Traction Cervical   Min (lbs) 6   Max (lbs) 12   Hold Time 60   Rest Time 15   Time 15     Manual Therapy   Manual Therapy Myofascial release;Passive ROM;Manual Traction   Myofascial Release post cervicals   Passive ROM rotation and sidebending   Manual Traction gentle      Neck Exercises: Stretches   Upper Trapezius Stretch 2 reps;30 seconds   Levator Stretch  3 reps;30 seconds                PT Education - 07/28/17 1401    Education provided Yes   Education Details HEP for supine scap stab    Person(s) Educated Patient   Methods Explanation   Comprehension Verbalized understanding             PT Long Term Goals - 07/21/17 2130      PT LONG TERM GOAL #1   Title Pt will be I with HEP for posture, cervical stabilization.    Time 6   Period Weeks   Status New   Target Date 09/01/17     PT LONG TERM GOAL #2   Title Pt will score <30% on FOTO to demo functional improvement    Time 6   Period Weeks   Status New   Target Date 09/01/17     PT LONG TERM GOAL #3   Title Pt will be able to report min to no symptoms in LUE with neck ROM, mobility.    Time 6   Period Weeks   Status New   Target Date 09/01/17     PT LONG TERM GOAL #  4   Title Pt will be able to lift, use LT UE for work related tasks with confidence and min to no UE sx, neck pain.    Time 6   Period Weeks   Status New   Target Date 09/01/17     PT LONG TERM GOAL #5   Title Pt will be able to sit without back support for short periods <30 min and be comfortable for meals, conversation.    Time 6   Period Weeks   Status New   Target Date 09/01/17               Plan - 07/28/17 1409    Clinical Impression Statement Patient reports having a better week, indicating a positive response to traction and stretching.  She has been more body aware this week.  Able to activate DNF in supine and maintain well for 20 sec.     PT Next Visit Plan cont stabilization.   repeat traction? , check HEP, posture    PT Home Exercise Plan neck tension stretches, chin tuck, supine scap stab. with chin tuck    Consulted and Agree with Plan of Care Patient      Patient will benefit from skilled therapeutic intervention in order to improve the following deficits and impairments:  Impaired UE functional use, Pain, Increased fascial restricitons, Decreased strength,  Decreased mobility, Improper body mechanics, Decreased range of motion, Impaired sensation  Visit Diagnosis: Cervicalgia  Radiculopathy, cervical region  Other disturbances of skin sensation     Problem List There are no active problems to display for this patient.   PAA,JENNIFER 07/28/2017, 2:13 PM  [facility] [address] [place], [place], [address] Phone: [phone]   Fax:  [phone]  Name: KIHANNA KAMIYA MRN: [id number] Date of Birth: 1961-02-01   Raeford Razor, PT 07/28/17 2:13 PM Phone: [phone] Fax: [phone]

## 2017-07-28 NOTE — Patient Instructions (Signed)
Stretch Break - Chin Tuck    Looking straight forward, tuck chin and hold __5__ seconds. Relax and return to starting position. Repeat __3 times every __1__ hours.  Copyright  VHI. All rights reserved.   Head Press With [place] chin SLIGHTLY toward chest, keep mouth closed. Feel weight on back of head. Increase weight by pressing head down. Hold __5_ seconds. Relax. Repeat _10__ times. Surface: floor   Copyright  VHI. All rights reserved.    Over Head Pull: Narrow Grip       On back, knees bent, feet flat, band across thighs, elbows straight but relaxed. Pull hands apart (start). Keeping elbows straight, bring arms up and over head, hands toward floor. Keep pull steady on band. Hold momentarily. Return slowly, keeping pull steady, back to start. Repeat __10_ times. Band color _____GREEN _   Side Pull: Double Arm   On back, knees bent, feet flat. Arms perpendicular to body, shoulder level, elbows straight but relaxed. Pull arms out to sides, elbows straight. Resistance band comes across collarbones, hands toward floor. Hold momentarily. Slowly return to starting position. Repeat _10__ times. Band color __GREEN ___   Sash   On back, knees bent, feet flat, left hand on left hip, right hand above left. Pull right arm DIAGONALLY (hip to shoulder) across chest. Bring right arm along head toward floor. Hold momentarily. Slowly return to starting position. Repeat _10__ times. Do with left arm. Band color __GREEN ____   Shoulder Rotation: Double Arm   On back, knees bent, feet flat, elbows tucked at sides, bent 90, hands palms up. Pull hands apart and down toward floor, keeping elbows near sides. Hold momentarily. Slowly return to starting position. Repeat _10__ times. Band color __GREEN ____

## 2017-08-01 ENCOUNTER — Ambulatory Visit: Payer: 59 | Attending: Neurosurgery | Admitting: Physical Therapy

## 2017-08-01 DIAGNOSIS — M5412 Radiculopathy, cervical region: Secondary | ICD-10-CM | POA: Diagnosis not present

## 2017-08-01 DIAGNOSIS — R208 Other disturbances of skin sensation: Secondary | ICD-10-CM | POA: Insufficient documentation

## 2017-08-01 DIAGNOSIS — M542 Cervicalgia: Secondary | ICD-10-CM | POA: Insufficient documentation

## 2017-08-01 NOTE — Patient Instructions (Signed)

## 2017-08-01 NOTE — Therapy (Addendum)
[place], [place], [address] Phone: [phone]   Fax:  [phone]  Physical Therapy Treatment  Patient Details  Name: [name] MRN: [id number] Date of Birth: Apr 16, 1961 Referring Provider: Dr. Erline Levine   Encounter Date: 08/01/2017      PT End of Session - 08/01/17 1157    Visit Number 3   Number of Visits 12   Date for PT Re-Evaluation 09/01/17   PT Start Time 0930   PT Stop Time 1020   PT Time Calculation (min) 50 min   Activity Tolerance Patient tolerated treatment well   Behavior During Therapy [facility] for tasks assessed/performed      Past Medical History:  Diagnosis Date  . Thyroid disease     Past Surgical History:  Procedure Laterality Date  . CESAREAN SECTION      There were no vitals filed for this visit.      Subjective Assessment - 08/01/17 1156    Subjective Patient reports no numbness. She is also having no pain    Pertinent History none   Limitations Sitting;Reading;Lifting;House hold activities;Other (comment)   Diagnostic tests MRI done 06/19/17 confirmed C5-C6 spondylosis and C7 disc herniation , XR not available   Patient Stated Goals Pt would like to avoid surgery.    Currently in Pain? No/denies          exercises: scap retraction and extension  2x10 green  Manual therapy: trigger point release to upper traps/ manual cervical traction Cervical traction Min/ Max 6/12 with ramp up                         PT Education - 08/01/17 1157    Education provided Yes   Education Details TPDN befnefits and risks    Person(s) Educated Patient   Methods Explanation;Demonstration;Tactile cues   Comprehension Verbalized understanding;Returned demonstration;Tactile cues required;Verbal cues required             PT Long Term Goals - 07/21/17 2130      PT LONG TERM GOAL #1   Title Pt will be I with HEP for posture, cervical  stabilization.    Time 6   Period Weeks   Status New   Target Date 09/01/17     PT LONG TERM GOAL #2   Title Pt will score <30% on FOTO to demo functional improvement    Time 6   Period Weeks   Status New   Target Date 09/01/17     PT LONG TERM GOAL #3   Title Pt will be able to report min to no symptoms in LUE with neck ROM, mobility.    Time 6   Period Weeks   Status New   Target Date 09/01/17     PT LONG TERM GOAL #4   Title Pt will be able to lift, use LT UE for work related tasks with confidence and min to no UE sx, neck pain.    Time 6   Period Weeks   Status New   Target Date 09/01/17     PT LONG TERM GOAL #5   Title Pt will be able to sit without back support for short periods <30 min and be comfortable for meals, conversation.    Time 6   Period Weeks   Status New   Target Date 09/01/17               Plan - 08/01/17  1200    Clinical Impression Statement Patient tolerated needling well. Therapy perfromed needling to thrracic paraspinals and lower trpa area. She had a good tiwtch respose. Therapy reviewed how to decrease post needle soreness. Continue to progress exercises as tolerated. Therapy added light posterior chain strengthening for HEP.    Clinical Presentation Stable   Clinical Decision Making Low   Rehab Potential Excellent   PT Frequency 2x / week   PT Duration 6 weeks   PT Treatment/Interventions ADLs/Self Care Home Management;Dry needling;Taping;Functional mobility training;Cryotherapy;Electrical Stimulation;Moist Heat;Traction;Ultrasound;Neuromuscular re-education;Passive range of motion;Therapeutic exercise;Manual techniques;Therapeutic activities;Patient/family education   PT Next Visit Plan cont stabilization.   repeat traction? , check HEP, posture    PT Home Exercise Plan neck tension stretches, chin tuck, supine scap stab. with chin tuck    Consulted and Agree with Plan of Care Patient      Patient will benefit from skilled  therapeutic intervention in order to improve the following deficits and impairments:  Impaired UE functional use, Pain, Increased fascial restricitons, Decreased strength, Decreased mobility, Improper body mechanics, Decreased range of motion, Impaired sensation  Visit Diagnosis: Cervicalgia  Radiculopathy, cervical region  Other disturbances of skin sensation     Problem List There are no active problems to display for this patient.   Carney Living PT DPT  08/01/2017, 12:03 PM  [facility] [address] [place], [place], [address] Phone: [phone]   Fax:  [phone]  Name: AMYIAH GABA MRN: [id number] Date of Birth: 1961-07-08

## 2017-08-04 DIAGNOSIS — M5412 Radiculopathy, cervical region: Secondary | ICD-10-CM | POA: Diagnosis not present

## 2017-08-04 DIAGNOSIS — M542 Cervicalgia: Secondary | ICD-10-CM | POA: Diagnosis not present

## 2017-08-04 DIAGNOSIS — M50222 Other cervical disc displacement at C5-C6 level: Secondary | ICD-10-CM | POA: Diagnosis not present

## 2017-08-05 ENCOUNTER — Ambulatory Visit: Payer: 59 | Admitting: Physical Therapy

## 2017-08-05 DIAGNOSIS — M542 Cervicalgia: Secondary | ICD-10-CM

## 2017-08-05 DIAGNOSIS — R208 Other disturbances of skin sensation: Secondary | ICD-10-CM | POA: Diagnosis not present

## 2017-08-05 DIAGNOSIS — M5412 Radiculopathy, cervical region: Secondary | ICD-10-CM

## 2017-08-06 ENCOUNTER — Encounter: Payer: Self-pay | Admitting: Physical Therapy

## 2017-08-06 NOTE — Therapy (Addendum)
[place], [place], [address] Phone: [phone]   Fax:  [phone]  Physical Therapy Treatment  Patient Details  Name: [name] MRN: [id number] Date of Birth: [birth year] Referring Provider: Dr. Erline Levine     Encounter Date: 08/05/2017  PT End of Session - 08/06/17 0815    Visit Number  4    Number of Visits  12    Date for PT Re-Evaluation  09/01/17    PT Start Time  1100    PT Stop Time  1145    PT Time Calculation (min)  45 min    Behavior During Therapy  [facility] for tasks assessed/performed       Past Medical History:  Diagnosis Date  . Thyroid disease     Past Surgical History:  Procedure Laterality Date  . CESAREAN SECTION      There were no vitals filed for this visit.  Subjective Assessment - 08/06/17 0812    Subjective  Patient rpeorts some right trap pain today. She is otherwise doing well. she has been working on her exercises and stretching. shefeels like she is progressing.     Limitations  Sitting;Reading;Lifting;House hold activities;Other (comment)    Diagnostic tests  MRI done 06/19/17 confirmed C5-C6 spondylosis and C7 disc herniation , XR not available    Patient Stated Goals  Pt would like to avoid surgery.     Currently in Pain?  Yes    Pain Score  2     Pain Location  Neck    Pain Orientation  Left    Pain Descriptors / Indicators  Tightness;Aching    Pain Type  Chronic pain    Pain Onset  More than a month ago    Pain Frequency  Intermittent    Aggravating Factors   leaning forward     Pain Relieving Factors  leaning back into the chiar    Effect of Pain on Daily Activities  pain at work                      [facility] Adult PT Treatment/Exercise - 08/06/17 0001      Shoulder Exercises: Prone   Other Prone Exercises  prone Y 2x10       Shoulder Exercises: Standing   Other Standing Exercises  green band extension and scpa retraction 2x10  each      Traction   Type of Traction  -- No radicualr symptoms so held    No radicualr symptoms so held      Manual Therapy   Manual Therapy  Myofascial release;Passive ROM;Manual Traction    Myofascial Release  post cervicals sub occipitals     Passive ROM  rotation and sidebending    Manual Traction  gentle       Neck Exercises: Stretches   Upper Trapezius Stretch  2 reps;30 seconds    Levator Stretch  3 reps;30 seconds       Trigger Point Dry Needling - 08/06/17 0826    Consent Given?  Yes    Education Handout Provided  Yes    Longissimus Response  Twitch response elicited left throacic paraspinals and lower trap    left throacic paraspinals and lower trap           PT Education - 08/06/17 0814    Education provided  Yes    Education Details  reviewed benefits and risks of TPDN; initiated low trap strengthening  Person(s) Educated  Patient    Methods  Demonstration;Explanation;Tactile cues    Comprehension  Verbalized understanding;Returned demonstration;Tactile cues required;Verbal cues required          PT Long Term Goals - 07/21/17 2130      PT LONG TERM GOAL #1   Title  Pt will be I with HEP for posture, cervical stabilization.     Time  6    Period  Weeks    Status  New    Target Date  09/01/17      PT LONG TERM GOAL #2   Title  Pt will score <30% on FOTO to demo functional improvement     Time  6    Period  Weeks    Status  New    Target Date  09/01/17      PT LONG TERM GOAL #3   Title  Pt will be able to report min to no symptoms in LUE with neck ROM, mobility.     Time  6    Period  Weeks    Status  New    Target Date  09/01/17      PT LONG TERM GOAL #4   Title  Pt will be able to lift, use LT UE for work related tasks with confidence and min to no UE sx, neck pain.     Time  6    Period  Weeks    Status  New    Target Date  09/01/17      PT LONG TERM GOAL #5   Title  Pt will be able to sit without back support for short periods  <30 min and be comfortable for meals, conversation.     Time  6    Period  Weeks    Status  New    Target Date  09/01/17            Plan - 08/06/17 0816    Clinical Impression Statement  Patient toerated treatment well. She had a very good twitch respose in her left upper trap. Reviewed how to decrease post needle soreness. Held traction 2nd to no radicular symptoms. Add back in as tolerated. Continue to progress exercises as tolerated.     Clinical Presentation  Stable    Clinical Decision Making  Low    Rehab Potential  Excellent    PT Frequency  2x / week    PT Duration  6 weeks    PT Treatment/Interventions  ADLs/Self Care Home Management;Dry needling;Taping;Functional mobility training;Cryotherapy;Electrical Stimulation;Moist Heat;Traction;Ultrasound;Neuromuscular re-education;Passive range of motion;Therapeutic exercise;Manual techniques;Therapeutic activities;Patient/family education    PT Next Visit Plan  cont stabilization.   repeat traction? , check HEP, posture     PT Home Exercise Plan  neck tension stretches, chin tuck, supine scap stab. with chin tuck     Consulted and Agree with Plan of Care  Patient       Patient will benefit from skilled therapeutic intervention in order to improve the following deficits and impairments:  Impaired UE functional use, Pain, Increased fascial restricitons, Decreased strength, Decreased mobility, Improper body mechanics, Decreased range of motion, Impaired sensation  Visit Diagnosis: Cervicalgia  Radiculopathy, cervical region  Other disturbances of skin sensation     Problem List There are no active problems to display for this patient.   Carney Living PT DPT  08/06/2017, 8:39 AM  [facility] [address] [place], [place], [address] Phone: [phone]   Fax:  [phone]  Name: TERRYE DOMBROSKY MRN: [id number] Date of Birth: 11-28-1960

## 2017-08-08 ENCOUNTER — Encounter: Payer: Self-pay | Admitting: Physical Therapy

## 2017-08-08 ENCOUNTER — Ambulatory Visit: Payer: 59 | Admitting: Physical Therapy

## 2017-08-08 DIAGNOSIS — M5412 Radiculopathy, cervical region: Secondary | ICD-10-CM

## 2017-08-08 DIAGNOSIS — R208 Other disturbances of skin sensation: Secondary | ICD-10-CM

## 2017-08-08 DIAGNOSIS — M542 Cervicalgia: Secondary | ICD-10-CM | POA: Diagnosis not present

## 2017-08-08 NOTE — Therapy (Signed)
[place], [place], [address] Phone: [phone]   Fax:  [phone]  Physical Therapy Treatment  Patient Details  Name: [name] MRN: [id number] Date of Birth: July 03, 1961 Referring Provider: Dr. Erline Levine    Encounter Date: 08/08/2017  PT End of Session - 08/08/17 1005    Visit Number  5    Number of Visits  12    Date for PT Re-Evaluation  09/01/17    PT Start Time  0932    PT Stop Time  1017    PT Time Calculation (min)  45 min    Activity Tolerance  Patient tolerated treatment well    Behavior During Therapy  [facility] for tasks assessed/performed       Past Medical History:  Diagnosis Date  . Thyroid disease     Past Surgical History:  Procedure Laterality Date  . CESAREAN SECTION      There were no vitals filed for this visit.  Subjective Assessment - 08/08/17 0935    Subjective  No pain today, I overdid it this week but I still havent had that Rt. trap pain.     Currently in Pain?  No/denies          [facility] Adult PT Treatment/Exercise - 08/08/17 0001      Neck Exercises: Supine   Other Supine Exercise  Supine foam roller for core see PT note       Traction   Type of Traction  Cervical    Min (lbs)  6    Max (lbs)  12    Hold Time  60    Rest Time  15    Time  15       Horiz abd with and without band Green x 10 each  Alternating arms flexion and extension x 10 Clam bilateral x 15  Bent knee raise  10  Dead bug x 10      PT Long Term Goals - 08/08/17 6256      PT LONG TERM GOAL #1   Title  Pt will be I with HEP for posture, cervical stabilization.     Status  On-going      PT LONG TERM GOAL #2   Title  Pt will score <30% on FOTO to demo functional improvement     Status  On-going      PT LONG TERM GOAL #3   Title  Pt will be able to report min to no symptoms in LUE with neck ROM, mobility.     Status  Achieved      PT LONG TERM GOAL #4   Title   Pt will be able to lift, use LT UE for work related tasks with confidence and min to no UE sx, neck pain.     Status  Partially Met      PT LONG TERM GOAL #5   Title  Pt will be able to sit without back support for short periods <30 min and be comfortable for meals, conversation.     Status  On-going            Plan - 08/08/17 0956    Clinical Impression Statement  Patient responding well to TrPDN and stabilization.  Symptoms are improving, can now sit without back support and control symptoms.      PT Next Visit Plan  cont stabilization.   repeat traction? , check HEP, posture add on to HEP for general  core, consider PIlates reformer     PT Home Exercise Plan  neck tension stretches, chin tuck, supine scap stab. with chin tuck     Consulted and Agree with Plan of Care  Patient       Patient will benefit from skilled therapeutic intervention in order to improve the following deficits and impairments:  Impaired UE functional use, Pain, Increased fascial restricitons, Decreased strength, Decreased mobility, Improper body mechanics, Decreased range of motion, Impaired sensation  Visit Diagnosis: Cervicalgia  Radiculopathy, cervical region  Other disturbances of skin sensation     Problem List There are no active problems to display for this patient.   PAA,JENNIFER 08/08/2017, 10:06 AM  [place], [place], [address] Phone: [phone]   Fax:  [phone]  Name: [name] MRN: [id number] Date of Birth: Mar 30, 1961  Raeford Razor, PT 08/08/17 10:06 AM Phone: [phone] Fax: [phone]

## 2017-08-11 ENCOUNTER — Ambulatory Visit: Payer: 59 | Admitting: Physical Therapy

## 2017-08-12 ENCOUNTER — Ambulatory Visit: Payer: 59 | Admitting: Physical Therapy

## 2017-08-12 ENCOUNTER — Encounter: Payer: Self-pay | Admitting: Physical Therapy

## 2017-08-12 DIAGNOSIS — M542 Cervicalgia: Secondary | ICD-10-CM | POA: Diagnosis not present

## 2017-08-12 DIAGNOSIS — M5412 Radiculopathy, cervical region: Secondary | ICD-10-CM

## 2017-08-12 DIAGNOSIS — R208 Other disturbances of skin sensation: Secondary | ICD-10-CM | POA: Diagnosis not present

## 2017-08-12 NOTE — Therapy (Signed)
[place], [place], [address] Phone: [phone]   Fax:  [phone]  Physical Therapy Treatment  Patient Details  Name: [name] MRN: [id number] Date of Birth: Mar 27, 1961 Referring Provider: Dr. Erline Levine    Encounter Date: 08/12/2017  PT End of Session - 08/12/17 0825    Visit Number  6    Number of Visits  12    Date for PT Re-Evaluation  09/01/17    PT Start Time  0805    PT Stop Time  7564    PT Time Calculation (min)  50 min    Activity Tolerance  Patient tolerated treatment well    Behavior During Therapy  [facility] for tasks assessed/performed       Past Medical History:  Diagnosis Date  . Thyroid disease     Past Surgical History:  Procedure Laterality Date  . CESAREAN SECTION      There were no vitals filed for this visit.  Subjective Assessment - 08/12/17 0811    Subjective  No pain.  I think I want to get the traction unit. Forgot about the appt yesterday.      Currently in Pain?  No/denies         [facility] PT Assessment - 08/12/17 0001      AROM   AROM Assessment Site  -- no pain     Cervical Flexion  60    Cervical Extension  57    Cervical - Right Side Bend  50    Cervical - Left Side Bend  45    Cervical - Right Rotation  65    Cervical - Left Rotation  70          [facility] Adult PT Treatment/Exercise - 08/12/17 0001      Traction   Type of Traction  Cervical    Min (lbs)  6    Max (lbs)  13    Hold Time  60    Rest Time  15    Time  15        Pilates Reformer used for LE/core strength, postural strength, lumbopelvic disassociation and core control.  Exercises included:  Supine Arm work 1 Red 1 yellow arcs in parallel and wide "V" x 10 , circles with 1 red   Needed manual A for maintaining table top and verbal for low abdominals, neck tension increases with fatigue.   Reverse Abdominals 1 blue forearm plank, LE s in x 10 , good head, neck and  shoulder organization  Long box Prone pulling straps, 1 red shoulder ext, triceps       PT Education - 08/12/17 0823    Education provided  Yes    Education Details  Pilates based core and UE , concepts of stab.     Person(s) Educated  Patient    Methods  Explanation    Comprehension  Verbalized understanding;Returned demonstration;Verbal cues required          PT Long Term Goals - 08/08/17 0953      PT LONG TERM GOAL #1   Title  Pt will be I with HEP for posture, cervical stabilization.     Status  On-going      PT LONG TERM GOAL #2   Title  Pt will score <30% on FOTO to demo functional improvement     Status  On-going      PT LONG TERM GOAL #3   Title  Pt  will be able to report min to no symptoms in LUE with neck ROM, mobility.     Status  Achieved      PT LONG TERM GOAL #4   Title  Pt will be able to lift, use LT UE for work related tasks with confidence and min to no UE sx, neck pain.     Status  Partially Met      PT LONG TERM GOAL #5   Title  Pt will be able to sit without back support for short periods <30 min and be comfortable for meals, conversation.     Status  On-going            Plan - 08/12/17 0957    Clinical Impression Statement  Pt without pain today. AROM is WFL without pain.  Mild L UE weakness notable in supine with PIlates Reformer.  She has good body awareness.  She is interested in home traction unit. She would like to continue PT for reinforcement of concepts, strength and possibly dry needling to Upper trap.      PT Next Visit Plan  cont stabilization.   repeat traction? , check HEP, posture add on to HEP for general core, consider PIlates reformer     PT Home Exercise Plan  neck tension stretches, chin tuck, supine scap stab. with chin tuck     Consulted and Agree with Plan of Care  Patient       Patient will benefit from skilled therapeutic intervention in order to improve the following deficits and impairments:  Impaired UE  functional use, Pain, Increased fascial restricitons, Decreased strength, Decreased mobility, Improper body mechanics, Decreased range of motion, Impaired sensation  Visit Diagnosis: Cervicalgia  Radiculopathy, cervical region  Other disturbances of skin sensation     Problem List There are no active problems to display for this patient.   [name] 08/12/2017, 10:05 AM  [place], [place], [address] Phone: [phone]   Fax:  [phone]  Name: [name] MRN: [id number] Date of Birth: [birth year]  Raeford Razor, PT 08/12/17 10:05 AM Phone: [phone] Fax: [phone]

## 2017-08-15 ENCOUNTER — Ambulatory Visit: Payer: 59 | Admitting: Physical Therapy

## 2017-08-15 ENCOUNTER — Encounter: Payer: Self-pay | Admitting: Physical Therapy

## 2017-08-15 DIAGNOSIS — R208 Other disturbances of skin sensation: Secondary | ICD-10-CM

## 2017-08-15 DIAGNOSIS — M5412 Radiculopathy, cervical region: Secondary | ICD-10-CM

## 2017-08-15 DIAGNOSIS — M542 Cervicalgia: Secondary | ICD-10-CM

## 2017-08-18 ENCOUNTER — Ambulatory Visit: Payer: 59 | Admitting: Physical Therapy

## 2017-08-18 ENCOUNTER — Encounter: Payer: Self-pay | Admitting: Physical Therapy

## 2017-08-18 DIAGNOSIS — M542 Cervicalgia: Secondary | ICD-10-CM | POA: Diagnosis not present

## 2017-08-18 DIAGNOSIS — M5412 Radiculopathy, cervical region: Secondary | ICD-10-CM

## 2017-08-18 DIAGNOSIS — R208 Other disturbances of skin sensation: Secondary | ICD-10-CM | POA: Diagnosis not present

## 2017-08-18 NOTE — Therapy (Signed)
[place], [place], [address] Phone: [phone]   Fax:  [phone]  Physical Therapy Treatment  Patient Details  Name: [name] MRN: [id number] Date of Birth: 1960/12/14 Referring Provider: Dr. Erline Levine    Encounter Date: 08/15/2017  PT End of Session - 08/18/17 0803    Visit Number  7    Number of Visits  12    Date for PT Re-Evaluation  09/01/17    PT Start Time  4174    PT Stop Time  1110    PT Time Calculation (min)  55 min    Activity Tolerance  Patient tolerated treatment well    Behavior During Therapy  [facility] for tasks assessed/performed       Past Medical History:  Diagnosis Date  . Thyroid disease     Past Surgical History:  Procedure Laterality Date  . CESAREAN SECTION      There were no vitals filed for this visit.  Subjective Assessment - 08/18/17 0801    Subjective  Patient reports no pain at this time. She has been working on exercises. She liked the Visual merchandiser. She is having just minor tigling form time to time.     Limitations  Sitting;Reading;Lifting;House hold activities;Other (comment)    Diagnostic tests  MRI done 06/19/17 confirmed C5-C6 spondylosis and C7 disc herniation , XR not available    Patient Stated Goals  Pt would like to avoid surgery.     Currently in Pain?  No/denies                      [facility] Adult PT Treatment/Exercise - 08/18/17 0001      Shoulder Exercises: Prone   Other Prone Exercises  plank 3x15 sec hold     Other Prone Exercises  quadruped atl UE/LE x10 each; Alt UE x10;       Shoulder Exercises: Standing   Other Standing Exercises  scap retraction green 2x10; Shpoulder extension 2x10 green     Other Standing Exercises  wall clock 1-3-5 5 times each       Traction   Type of Traction  Cervical    Min (lbs)  6    Max (lbs)  13    Hold Time  60    Rest Time  15    Time  15             PT Education -  08/18/17 0802    Education provided  Yes    Education Details  reviewed higher level ther-ex. Reviewed symptom mangement if the patient     Person(s) Educated  Patient    Methods  Explanation    Comprehension  Verbalized understanding;Returned demonstration;Verbal cues required          PT Long Term Goals - 08/08/17 0953      PT LONG TERM GOAL #1   Title  Pt will be I with HEP for posture, cervical stabilization.     Status  On-going      PT LONG TERM GOAL #2   Title  Pt will score <30% on FOTO to demo functional improvement     Status  On-going      PT LONG TERM GOAL #3   Title  Pt will be able to report min to no symptoms in LUE with neck ROM, mobility.     Status  Achieved      PT LONG TERM GOAL #4  Title  Pt will be able to lift, use LT UE for work related tasks with confidence and min to no UE sx, neck pain.     Status  Partially Met      PT LONG TERM GOAL #5   Title  Pt will be able to sit without back support for short periods <30 min and be comfortable for meals, conversation.     Status  On-going            Plan - 08/18/17 0804    Clinical Impression Statement  Patient tolerated treatment well. Patient reports the traction is having a positive effect on her numbness so therapy continued. She would benefit from further skilled therapy to continue to develope her HEP. Continue with manual therapy and dry needling as needed.     Clinical Presentation due to:  C    Clinical Decision Making  Low    Rehab Potential  Excellent    PT Frequency  2x / week    PT Duration  6 weeks    PT Treatment/Interventions  ADLs/Self Care Home Management;Dry needling;Taping;Functional mobility training;Cryotherapy;Electrical Stimulation;Moist Heat;Traction;Ultrasound;Neuromuscular re-education;Passive range of motion;Therapeutic exercise;Manual techniques;Therapeutic activities;Patient/family education    PT Next Visit Plan  cont stabilization.   repeat traction? , check HEP,  posture add on to HEP for general core, consider PIlates reformer     PT Home Exercise Plan  neck tension stretches, chin tuck, supine scap stab. with chin tuck     Consulted and Agree with Plan of Care  Patient       Patient will benefit from skilled therapeutic intervention in order to improve the following deficits and impairments:  Impaired UE functional use, Pain, Increased fascial restricitons, Decreased strength, Decreased mobility, Improper body mechanics, Decreased range of motion, Impaired sensation  Visit Diagnosis: Cervicalgia  Radiculopathy, cervical region  Other disturbances of skin sensation     Problem List There are no active problems to display for this patient.   Carney Living PT DPT  08/18/2017, 8:07 AM  [facility] [address] [place], [place], [address] Phone: [phone]   Fax:  [phone]  Name: [name] MRN: [id number] Date of Birth: [birth year]

## 2017-08-18 NOTE — Therapy (Addendum)
[place], [place], [address] Phone: [phone]   Fax:  [phone]  Physical Therapy Treatment and Addended Discharge  Patient Details  Name: [name] MRN: [id number] Date of Birth: 09/13/1961 Referring Provider: Dr. Erline Levine    Encounter Date: 08/18/2017  PT End of Session - 08/18/17 0937    Visit Number  8    Number of Visits  12    Date for PT Re-Evaluation  09/01/17    PT Start Time  9381 pt arrived late    PT Stop Time  1006    PT Time Calculation (min)  29 min    Activity Tolerance  Patient tolerated treatment well    Behavior During Therapy  [facility] for tasks assessed/performed       Past Medical History:  Diagnosis Date  . Thyroid disease     Past Surgical History:  Procedure Laterality Date  . CESAREAN SECTION      There were no vitals filed for this visit.  Subjective Assessment - 08/18/17 0939    Subjective  denies pain today, enjoyed the reformer. a tiny bit of tingling once or twice over last two days, lasting about 20s.                       [facility] Adult PT Treatment/Exercise - 08/18/17 0941      Exercises   Exercises  Other Exercises reformer see note       Pilates Reformer used for LE/core strength, postural strength, lumbopelvic disassociation and core control.  Exercises included: Supine Arm work 1R1Y press, circles (legs in table top) Quadruped 1Y plank push away, 1R1Y elbows on box press away  Long box Prone 1R1Y UE extension          PT Education - 08/18/17 0802    Education provided  Yes    Education Details  reviewed higher level ther-ex. Reviewed symptom mangement if the patient     Person(s) Educated  Patient    Methods  Explanation    Comprehension  Verbalized understanding;Returned demonstration;Verbal cues required          PT Long Term Goals - 08/08/17 0953      PT LONG TERM GOAL #1   Title  Pt will be I with HEP  for posture, cervical stabilization.     Status  On-going      PT LONG TERM GOAL #2   Title  Pt will score <30% on FOTO to demo functional improvement     Status  On-going      PT LONG TERM GOAL #3   Title  Pt will be able to report min to no symptoms in LUE with neck ROM, mobility.     Status  Achieved      PT LONG TERM GOAL #4   Title  Pt will be able to lift, use LT UE for work related tasks with confidence and min to no UE sx, neck pain.     Status  Partially Met      PT LONG TERM GOAL #5   Title  Pt will be able to sit without back support for short periods <30 min and be comfortable for meals, conversation.     Status  On-going            Plan - 08/18/17 1008    Clinical Impression Statement  good tolerance to exercises today. cues for breathing, chin tuck and  pelvic tilt while on reformer. Reports she is feeling really good and is prepared for d/c at next visit.     PT Treatment/Interventions  ADLs/Self Care Home Management;Dry needling;Taping;Functional mobility training;Cryotherapy;Electrical Stimulation;Moist Heat;Traction;Ultrasound;Neuromuscular re-education;Passive range of motion;Therapeutic exercise;Manual techniques;Therapeutic activities;Patient/family education    PT Next Visit Plan  d/c    PT Home Exercise Plan  neck tension stretches, chin tuck, supine scap stab. with chin tuck     Consulted and Agree with Plan of Care  Patient       Patient will benefit from skilled therapeutic intervention in order to improve the following deficits and impairments:  Impaired UE functional use, Pain, Increased fascial restricitons, Decreased strength, Decreased mobility, Improper body mechanics, Decreased range of motion, Impaired sensation  Visit Diagnosis: Cervicalgia  Radiculopathy, cervical region     Problem List There are no active problems to display for this patient.   Esha Fincher C. Shamra Bradeen PT, DPT 08/18/17 10:10 AM   [facility] [address] [place], [place], [address] Phone: [phone]   Fax:  [phone]  Name: [name] MRN: [id number] Date of Birth: 08-25-1961   PHYSICAL THERAPY DISCHARGE SUMMARY  Visits from Start of Care: 8  Current functional level related to goals / functional outcomes: See above for most recent info    Remaining deficits: None limiting function, as of last visit, had min pain and weakness   Education / Equipment: HEP, posture, Pilates , traction   Plan: Patient agrees to discharge.  Patient goals were met. Patient is being discharged due to not returning since the last visit.  ?????     Was close to discharge and did not reply to a message I left her to check her status.  I assume she is pleased with her outcome of PT.   Raeford Razor, PT 09/03/17 2:33 PM Phone: [phone] Fax: [phone]

## 2017-08-20 ENCOUNTER — Ambulatory Visit: Payer: 59 | Admitting: Physical Therapy

## 2017-08-22 ENCOUNTER — Encounter: Payer: 59 | Admitting: Physical Therapy

## 2017-11-20 ENCOUNTER — Other Ambulatory Visit: Payer: Self-pay | Admitting: Obstetrics & Gynecology

## 2017-11-20 DIAGNOSIS — Z1231 Encounter for screening mammogram for malignant neoplasm of breast: Secondary | ICD-10-CM

## 2017-12-09 ENCOUNTER — Ambulatory Visit
Admission: RE | Admit: 2017-12-09 | Discharge: 2017-12-09 | Disposition: A | Discharge status: Home / Self Care | Payer: No Typology Code available for payment source | Source: Ambulatory Visit | Attending: Obstetrics & Gynecology | Admitting: Obstetrics & Gynecology

## 2017-12-09 DIAGNOSIS — Z1231 Encounter for screening mammogram for malignant neoplasm of breast: Secondary | ICD-10-CM

## 2018-02-11 ENCOUNTER — Other Ambulatory Visit: Payer: Self-pay | Admitting: Family Medicine

## 2018-02-11 DIAGNOSIS — E2839 Other primary ovarian failure: Secondary | ICD-10-CM

## 2018-03-30 ENCOUNTER — Ambulatory Visit
Admission: RE | Admit: 2018-03-30 | Discharge: 2018-03-30 | Disposition: A | Discharge status: Home / Self Care | Payer: No Typology Code available for payment source | Source: Ambulatory Visit | Attending: Family Medicine | Admitting: Family Medicine

## 2018-03-30 DIAGNOSIS — E2839 Other primary ovarian failure: Secondary | ICD-10-CM

## 2019-10-27 DIAGNOSIS — H2513 Age-related nuclear cataract, bilateral: Secondary | ICD-10-CM | POA: Diagnosis not present

## 2019-10-27 DIAGNOSIS — H25043 Posterior subcapsular polar age-related cataract, bilateral: Secondary | ICD-10-CM | POA: Diagnosis not present

## 2019-10-27 DIAGNOSIS — H25013 Cortical age-related cataract, bilateral: Secondary | ICD-10-CM | POA: Diagnosis not present

## 2019-10-27 DIAGNOSIS — H2511 Age-related nuclear cataract, right eye: Secondary | ICD-10-CM | POA: Diagnosis not present

## 2019-12-14 DIAGNOSIS — H25012 Cortical age-related cataract, left eye: Secondary | ICD-10-CM | POA: Diagnosis not present

## 2019-12-14 DIAGNOSIS — H2512 Age-related nuclear cataract, left eye: Secondary | ICD-10-CM | POA: Diagnosis not present

## 2019-12-14 DIAGNOSIS — H25811 Combined forms of age-related cataract, right eye: Secondary | ICD-10-CM | POA: Diagnosis not present

## 2019-12-14 DIAGNOSIS — H25042 Posterior subcapsular polar age-related cataract, left eye: Secondary | ICD-10-CM | POA: Diagnosis not present

## 2019-12-14 DIAGNOSIS — H2511 Age-related nuclear cataract, right eye: Secondary | ICD-10-CM | POA: Diagnosis not present

## 2019-12-21 DIAGNOSIS — H2512 Age-related nuclear cataract, left eye: Secondary | ICD-10-CM | POA: Diagnosis not present

## 2019-12-21 DIAGNOSIS — H25812 Combined forms of age-related cataract, left eye: Secondary | ICD-10-CM | POA: Diagnosis not present

## 2020-01-12 DIAGNOSIS — Z1159 Encounter for screening for other viral diseases: Secondary | ICD-10-CM | POA: Diagnosis not present

## 2020-01-17 DIAGNOSIS — Z1211 Encounter for screening for malignant neoplasm of colon: Secondary | ICD-10-CM | POA: Diagnosis not present

## 2020-02-01 DIAGNOSIS — E039 Hypothyroidism, unspecified: Secondary | ICD-10-CM | POA: Diagnosis not present

## 2020-02-07 ENCOUNTER — Other Ambulatory Visit (HOSPITAL_COMMUNITY): Payer: Self-pay | Admitting: Internal Medicine

## 2020-02-07 DIAGNOSIS — E039 Hypothyroidism, unspecified: Secondary | ICD-10-CM | POA: Diagnosis not present

## 2020-02-07 DIAGNOSIS — E063 Autoimmune thyroiditis: Secondary | ICD-10-CM | POA: Diagnosis not present

## 2020-02-22 ENCOUNTER — Ambulatory Visit: Payer: 59 | Admitting: Dermatology

## 2020-02-22 ENCOUNTER — Encounter: Payer: Self-pay | Admitting: Dermatology

## 2020-02-22 ENCOUNTER — Other Ambulatory Visit: Payer: Self-pay

## 2020-02-22 DIAGNOSIS — D2371 Other benign neoplasm of skin of right lower limb, including hip: Secondary | ICD-10-CM | POA: Diagnosis not present

## 2020-02-22 DIAGNOSIS — Z1283 Encounter for screening for malignant neoplasm of skin: Secondary | ICD-10-CM | POA: Diagnosis not present

## 2020-02-22 DIAGNOSIS — D1801 Hemangioma of skin and subcutaneous tissue: Secondary | ICD-10-CM | POA: Diagnosis not present

## 2020-02-22 DIAGNOSIS — L570 Actinic keratosis: Secondary | ICD-10-CM | POA: Diagnosis not present

## 2020-02-22 DIAGNOSIS — L821 Other seborrheic keratosis: Secondary | ICD-10-CM | POA: Diagnosis not present

## 2020-02-22 DIAGNOSIS — D229 Melanocytic nevi, unspecified: Secondary | ICD-10-CM

## 2020-02-22 DIAGNOSIS — D225 Melanocytic nevi of trunk: Secondary | ICD-10-CM

## 2020-02-22 DIAGNOSIS — D239 Other benign neoplasm of skin, unspecified: Secondary | ICD-10-CM

## 2020-02-22 NOTE — Progress Notes (Signed)
   New Patient   Subjective  Taleeya A Justin Mend is a 59 y.o. female who presents for the following: Annual Exam (skin check all over lived in [place] for 30 years).  Moles Location: All over Duration: Years Quality: Some Chest Associated Signs/Symptoms: Modifying Factors: Decades of [place] sunlight Severity:  Timing: Context:    The following portions of the chart were reviewed this encounter and updated as appropriate: Tobacco  Allergies  Meds  Problems  Med Hx  Surg Hx  Fam Hx      Objective  Well appearing patient in no apparent distress; mood and affect are within normal limits.  A full examination was performed including scalp, head, eyes, ears, nose, lips, neck, chest, axillae, abdomen, back, buttocks, bilateral upper extremities, bilateral lower extremities, hands, feet, fingers, toes, fingernails, and toenails. All findings within normal limits unless otherwise noted below.   Assessment & Plan  AK (actinic keratosis) Left Buccal Cheek   Destruction of lesion - Left Buccal Cheek   Destruction method: cryotherapy   Informed consent: discussed and consent obtained   Timeout:  patient name, date of birth, surgical site, and procedure verified Lesion destroyed using liquid nitrogen: Yes   Region frozen until ice ball extended beyond lesion: Yes   Cryotherapy cycles:  5 Outcome: patient tolerated procedure well with no complications    Dermatofibroma Right Thigh - Anterior  Leave if stable  Nevus Mid Back  Self examined twice annually, general skin check once yearly  Seborrheic keratosis (3) Left Popliteal Fossa; Chest - Medial [facility]); Right Abdomen (side) - Upper  Leave if stable General skin check on radiation interventional tech Juliette Montanye-Cushman.  She lived in [place] for many years and is concerned that there may be quite a bit of sun damage.  From lower legs to scalp I did not see any atypical moles nor nonmole skin cancer.  She is prone  to get small seborrheic keratoses with most of lesions being on the lower sternum and solitary lesions below the right outer breast and on the back of the left knee.  There may be 1 or 2 small ones in the scalp as well.  On the right front thigh is a 3 mm firm pink dermal papule that fits a benign dermatofibroma.  She has scattered cherry angiomas, the largest one being on the right lower abdomen.  On the left outer cheek is a more feelable gritty pink 1 cm crust which is a solar keratosis and was treated with 5-second liquid nitrogen spray.  This will swell and may peel in the next 2 weeks.  Juliette knows there is no special care restriction for this.  Should this fail, we will consider biopsy or possibly light based PDT in the late fall or winter.  We also discussed options for her minor aesthetic sun damage on the upper chest and face.  She has previously seen Dr. Binnie Kand for chemical peel and I mentioned to her that I also trust Dr. Darrin Luis a plastic surgeon in [place].  Routine general skin check in 1 year. Skin cancer screening performed today.

## 2020-02-23 ENCOUNTER — Encounter: Payer: Self-pay | Admitting: Dermatology

## 2020-02-29 ENCOUNTER — Ambulatory Visit: Payer: No Typology Code available for payment source | Admitting: Physician Assistant

## 2020-03-27 DIAGNOSIS — E063 Autoimmune thyroiditis: Secondary | ICD-10-CM | POA: Diagnosis not present

## 2020-03-27 DIAGNOSIS — E039 Hypothyroidism, unspecified: Secondary | ICD-10-CM | POA: Diagnosis not present

## 2020-05-03 DIAGNOSIS — H26491 Other secondary cataract, right eye: Secondary | ICD-10-CM | POA: Diagnosis not present

## 2020-05-03 DIAGNOSIS — Z961 Presence of intraocular lens: Secondary | ICD-10-CM | POA: Diagnosis not present

## 2020-05-15 DIAGNOSIS — H26492 Other secondary cataract, left eye: Secondary | ICD-10-CM | POA: Diagnosis not present

## 2020-05-23 DIAGNOSIS — Z961 Presence of intraocular lens: Secondary | ICD-10-CM | POA: Diagnosis not present

## 2020-08-14 DIAGNOSIS — Z0000 Encounter for general adult medical examination without abnormal findings: Secondary | ICD-10-CM | POA: Diagnosis not present

## 2020-08-14 DIAGNOSIS — E2839 Other primary ovarian failure: Secondary | ICD-10-CM | POA: Diagnosis not present

## 2020-08-14 DIAGNOSIS — Z13220 Encounter for screening for lipoid disorders: Secondary | ICD-10-CM | POA: Diagnosis not present

## 2020-09-07 ENCOUNTER — Other Ambulatory Visit: Payer: Self-pay | Admitting: Physician Assistant

## 2020-09-07 DIAGNOSIS — E2839 Other primary ovarian failure: Secondary | ICD-10-CM

## 2021-01-08 ENCOUNTER — Other Ambulatory Visit (HOSPITAL_COMMUNITY): Payer: Self-pay

## 2021-01-08 MED FILL — Levothyroxine Sodium Tab 112 MCG: ORAL | 90 days supply | Qty: 90 | Fill #0 | Status: AC

## 2021-01-15 ENCOUNTER — Ambulatory Visit
Admission: RE | Admit: 2021-01-15 | Discharge: 2021-01-15 | Disposition: A | Discharge status: Home / Self Care | Payer: 59 | Source: Ambulatory Visit | Attending: Physician Assistant | Admitting: Physician Assistant

## 2021-01-15 ENCOUNTER — Other Ambulatory Visit: Payer: Self-pay

## 2021-01-15 DIAGNOSIS — E2839 Other primary ovarian failure: Secondary | ICD-10-CM

## 2021-01-15 DIAGNOSIS — M85852 Other specified disorders of bone density and structure, left thigh: Secondary | ICD-10-CM | POA: Diagnosis not present

## 2021-01-15 DIAGNOSIS — Z780 Asymptomatic menopausal state: Secondary | ICD-10-CM | POA: Diagnosis not present

## 2021-01-22 DIAGNOSIS — H43812 Vitreous degeneration, left eye: Secondary | ICD-10-CM | POA: Diagnosis not present

## 2021-01-29 ENCOUNTER — Encounter: Payer: Self-pay | Admitting: Dermatology

## 2021-01-29 ENCOUNTER — Other Ambulatory Visit: Payer: Self-pay

## 2021-01-29 ENCOUNTER — Ambulatory Visit: Payer: 59 | Admitting: Dermatology

## 2021-01-29 DIAGNOSIS — L738 Other specified follicular disorders: Secondary | ICD-10-CM

## 2021-01-29 DIAGNOSIS — Z1283 Encounter for screening for malignant neoplasm of skin: Secondary | ICD-10-CM

## 2021-01-29 DIAGNOSIS — E063 Autoimmune thyroiditis: Secondary | ICD-10-CM | POA: Diagnosis not present

## 2021-01-29 DIAGNOSIS — D1801 Hemangioma of skin and subcutaneous tissue: Secondary | ICD-10-CM | POA: Diagnosis not present

## 2021-01-29 DIAGNOSIS — E039 Hypothyroidism, unspecified: Secondary | ICD-10-CM | POA: Diagnosis not present

## 2021-01-29 DIAGNOSIS — L821 Other seborrheic keratosis: Secondary | ICD-10-CM | POA: Diagnosis not present

## 2021-02-06 ENCOUNTER — Other Ambulatory Visit (HOSPITAL_COMMUNITY): Payer: Self-pay

## 2021-02-06 DIAGNOSIS — E039 Hypothyroidism, unspecified: Secondary | ICD-10-CM | POA: Diagnosis not present

## 2021-02-06 DIAGNOSIS — E063 Autoimmune thyroiditis: Secondary | ICD-10-CM | POA: Diagnosis not present

## 2021-02-06 DIAGNOSIS — M85852 Other specified disorders of bone density and structure, left thigh: Secondary | ICD-10-CM | POA: Diagnosis not present

## 2021-02-06 MED ORDER — LEVOTHYROXINE SODIUM 112 MCG PO TABS
112.0000 ug | ORAL_TABLET | Freq: Every morning | ORAL | 4 refills | Status: DC
  Filled 2021-02-06 – 2021-04-10 (×2): qty 90, 90d supply, fill #0
  Filled 2021-07-09: qty 90, 90d supply, fill #1
  Filled 2021-10-09: qty 90, 90d supply, fill #2
  Filled 2022-01-08: qty 90, 90d supply, fill #3

## 2021-02-07 ENCOUNTER — Encounter: Payer: Self-pay | Admitting: Dermatology

## 2021-02-07 NOTE — Progress Notes (Signed)
   Follow-Up Visit   Subjective  Korri Ask Edison Pace is a 60 y.o. female who presents for the following: Annual Exam (Left thigh- feels a spot- no concerns ).  Newer spot left thigh, general skin check Location:  Duration:  Quality:  Associated Signs/Symptoms: Modifying Factors:  Severity:  Timing: Context:   Objective  Well appearing patient in no apparent distress; mood and affect are within normal limits. Objective  Left Abdomen (side) - Lower: General skin examination (areas beneath undergarments not examined): No atypical pigmented lesions or non--melanoma skin cancer.  Objective  Left Thigh - Anterior: 5 mm flattopped textured brown papule, dermoscopy typical  Objective  Mid Forehead: 2 mm flesh-colored eccentrically delled papule  Objective  Left Upper Back: Smooth 4 mm violet colored papule, dermoscopy typical    A full examination was performed including scalp, head, eyes, ears, nose, lips, neck, chest, axillae, abdomen, back, buttocks, bilateral upper extremities, bilateral lower extremities, hands, feet, fingers, toes, fingernails, and toenails. All findings within normal limits unless otherwise noted below.  Areas beneath undergarments not fully examined.   Assessment & Plan    Encounter for screening for malignant neoplasm of skin Left Abdomen (side) - Lower  Annual skin examination, encouraged to self examine twice annually.  Continued ultraviolet protection.  Seborrheic keratosis Left Thigh - Anterior  Leave if stable.  Sebaceous hyperplasia of face Mid Forehead  Told of resemblance to early [facility] so if there is growth or bleeding return for biopsy.  Hemangioma of skin Left Upper Back  No intervention necessary.      I, Lavonna Monarch, MD, have reviewed all documentation for this visit.  The documentation on 02/07/21 for the exam, diagnosis, procedures, and orders are all accurate and complete.

## 2021-04-10 ENCOUNTER — Other Ambulatory Visit (HOSPITAL_COMMUNITY): Payer: Self-pay

## 2021-06-20 ENCOUNTER — Telehealth: Payer: 59 | Admitting: Physician Assistant

## 2021-06-20 ENCOUNTER — Other Ambulatory Visit (HOSPITAL_COMMUNITY): Payer: Self-pay

## 2021-06-20 DIAGNOSIS — T63461A Toxic effect of venom of wasps, accidental (unintentional), initial encounter: Secondary | ICD-10-CM | POA: Diagnosis not present

## 2021-06-20 MED ORDER — METHYLPREDNISOLONE 4 MG PO TBPK
ORAL_TABLET | ORAL | 0 refills | Status: DC
  Filled 2021-06-20: qty 21, 6d supply, fill #0

## 2021-06-20 NOTE — Progress Notes (Signed)
Virtual Visit Consent   [name], you are scheduled for a virtual visit with a [place] [facility] provider today.     Just as with appointments in the office, your consent must be obtained to participate.  Your consent will be active for this visit and any virtual visit you may have with one of our providers in the next 365 days.     If you have a MyChart account, a copy of this consent can be sent to you electronically.  All virtual visits are billed to your insurance company just like a traditional visit in the office.    As this is a virtual visit, video technology does not allow for your provider to perform a traditional examination.  This may limit your provider's ability to fully assess your condition.  If your provider identifies any concerns that need to be evaluated in person or the need to arrange testing (such as labs, EKG, etc.), we will make arrangements to do so.     Although advances in technology are sophisticated, we cannot ensure that it will always work on either your end or our end.  If the connection with a video visit is poor, the visit may have to be switched to a telephone visit.  With either a video or telephone visit, we are not always able to ensure that we have a secure connection.     I need to obtain your verbal consent now.   Are you willing to proceed with your visit today?    PAULETTE ROCKFORD has provided verbal consent on 06/20/2021 for a virtual visit (video or telephone).   [name], [place]   Date: 06/20/2021 8:50 AM   Virtual Visit via Video Note   I, [name], connected with  [name]  ([id number], [birth year]) on 06/20/21 at  8:45 AM EDT by a video-enabled telemedicine application and verified that I am speaking with the correct person using two identifiers.  Location: Patient: Virtual Visit Location Patient: Home Provider: Virtual Visit Location Provider: Home Office   I discussed the limitations of evaluation and management by  telemedicine and the availability of in person appointments. The patient expressed understanding and agreed to proceed.    History of Present Illness: [name] is a 60 y.o. who identifies as a female who was assigned female at birth, and is being seen today for wasp sting of her right hand yesterday with subsequent significant swelling. Started NSAIDs and Benadryl without much improvement. Today swelling is worse, going further up the arm with a couple of isolated hives. Denies SOB. Denies significant pain but some mild discomfort about 3/10. Noting significant itch.   HPI: HPI  Problems: There are no problems to display for this patient.   Allergies:  Allergies  Allergen Reactions   Thimerosal    Medications:  Current Outpatient Medications:    methylPREDNISolone (MEDROL DOSEPAK) 4 MG TBPK tablet, Take following package directions, Disp: 21 tablet, Rfl: 0   levothyroxine (SYNTHROID) 112 MCG tablet, TAKE 1 TABLET BY MOUTH ON AN EMPTY STOMACH 30 MINUTES BEFORE BREAKFAST DAILY, Disp: 90 tablet, Rfl: 4  Observations/Objective: Patient is well-developed, well-nourished in no acute distress.  Resting comfortably at home.  Head is normocephalic, atraumatic.  No labored breathing. Speech is clear and coherent with logical content.  Patient is alert and oriented at baseline.  R arm observed with swelling noted of wrist and hand, extending proximally up toward elbow. Able to move wrist without issue.  Assessment and Plan: 1. Wasp sting, accidental or unintentional, initial encounter - methylPREDNISolone (MEDROL DOSEPAK) 4 MG TBPK tablet; Take following package directions  Dispense: 21 tablet; Refill: 0 Will add wasp sting to allergy list. First reaction like this. She should follow-up with PCP regarding this new allergy. Will start her on Medrol dose pack for swelling. Can continue antihistamine OTC with this. Ice and elevation recommended. Strict in-person evaluation precautions reviewed.    Follow Up Instructions: I discussed the assessment and treatment plan with the patient. The patient was provided an opportunity to ask questions and all were answered. The patient agreed with the plan and demonstrated an understanding of the instructions.  A copy of instructions were sent to the patient via MyChart.  The patient was advised to call back or seek an in-person evaluation if the symptoms worsen or if the condition fails to improve as anticipated.  Time:  I spent 10 minutes with the patient via telehealth technology discussing the above problems/concerns.    Michele Rio, PA-C

## 2021-06-20 NOTE — Patient Instructions (Signed)
  [name], thank you for joining [name], PA-C for today's virtual visit.  While this provider is not your primary care provider (PCP), if your PCP is located in our provider database this encounter information will be shared with them immediately following your visit.  Consent: (Patient) [name] provided verbal consent for this virtual visit at the beginning of the encounter.  Current Medications:  Current Outpatient Medications:    methylPREDNISolone (MEDROL DOSEPAK) 4 MG TBPK tablet, Take following package directions, Disp: 21 tablet, Rfl: 0   levothyroxine (SYNTHROID) 112 MCG tablet, TAKE 1 TABLET BY MOUTH ON AN EMPTY STOMACH 30 MINUTES BEFORE BREAKFAST DAILY, Disp: 90 tablet, Rfl: 4   Medications ordered in this encounter:  Meds ordered this encounter  Medications   methylPREDNISolone (MEDROL DOSEPAK) 4 MG TBPK tablet    Sig: Take following package directions    Dispense:  21 tablet    Refill:  0    1 standard blister pack dosing.    Order Specific Question:   Supervising Provider    Answer:   Noemi Chapel [[id number]]     *If you need refills on other medications prior to your next appointment, please contact your pharmacy*  Follow-Up: Call back or seek an in-person evaluation if the symptoms worsen or if the condition fails to improve as anticipated.  Other Instructions Please ice and elevate the extremity when possible.  You can continue the Benadryl OTC when you are home. Take the Medrol steroid pack as directed for swelling. If you note anything worsening after being on steroid for 24 hours, you need an in-person evaluation. I have added wasps to allergy list. I do recommend a follow-up visit with your PCP to discuss this new allergen for potential Epi-Pen.    If you have been instructed to have an in-person evaluation today at a local Urgent Care facility, please use the link below. It will take you to a list of all of our available [facility], including address, phone number and hours of operation. Please do not delay care.  [facility]  If you or a family member do not have a primary care provider, use the link below to schedule a visit and establish care. When you choose a [facility] primary care physician or advanced practice provider, you gain a long-term partner in health. Find a Primary Care Provider  Learn more about 's in-office and virtual care options: [facility]

## 2021-07-09 ENCOUNTER — Other Ambulatory Visit (HOSPITAL_COMMUNITY): Payer: Self-pay

## 2021-08-20 DIAGNOSIS — Z0000 Encounter for general adult medical examination without abnormal findings: Secondary | ICD-10-CM | POA: Diagnosis not present

## 2021-08-20 DIAGNOSIS — E039 Hypothyroidism, unspecified: Secondary | ICD-10-CM | POA: Diagnosis not present

## 2021-08-20 DIAGNOSIS — E2839 Other primary ovarian failure: Secondary | ICD-10-CM | POA: Diagnosis not present

## 2021-08-20 DIAGNOSIS — Z13220 Encounter for screening for lipoid disorders: Secondary | ICD-10-CM | POA: Diagnosis not present

## 2021-08-20 DIAGNOSIS — Z23 Encounter for immunization: Secondary | ICD-10-CM | POA: Diagnosis not present

## 2021-08-20 DIAGNOSIS — M8580 Other specified disorders of bone density and structure, unspecified site: Secondary | ICD-10-CM | POA: Diagnosis not present

## 2021-08-20 DIAGNOSIS — E063 Autoimmune thyroiditis: Secondary | ICD-10-CM | POA: Diagnosis not present

## 2021-10-09 ENCOUNTER — Other Ambulatory Visit (HOSPITAL_COMMUNITY): Payer: Self-pay

## 2021-11-01 ENCOUNTER — Other Ambulatory Visit (HOSPITAL_COMMUNITY): Payer: Self-pay

## 2021-11-02 ENCOUNTER — Other Ambulatory Visit (HOSPITAL_COMMUNITY): Payer: Self-pay

## 2021-11-02 MED ORDER — CARESTART COVID-19 HOME TEST VI KIT
PACK | 0 refills | Status: AC
  Filled 2021-11-02 – 2021-11-12 (×2): qty 4, 4d supply, fill #0

## 2021-11-12 ENCOUNTER — Other Ambulatory Visit (HOSPITAL_COMMUNITY): Payer: Self-pay

## 2021-11-20 ENCOUNTER — Other Ambulatory Visit (HOSPITAL_COMMUNITY): Payer: Self-pay

## 2022-01-08 ENCOUNTER — Other Ambulatory Visit (HOSPITAL_COMMUNITY): Payer: Self-pay

## 2022-01-09 ENCOUNTER — Other Ambulatory Visit (HOSPITAL_COMMUNITY): Payer: Self-pay

## 2022-03-04 ENCOUNTER — Ambulatory Visit: Payer: 59 | Admitting: Dermatology

## 2022-03-04 DIAGNOSIS — Z1283 Encounter for screening for malignant neoplasm of skin: Secondary | ICD-10-CM | POA: Diagnosis not present

## 2022-03-04 DIAGNOSIS — D1801 Hemangioma of skin and subcutaneous tissue: Secondary | ICD-10-CM

## 2022-03-04 DIAGNOSIS — Q825 Congenital non-neoplastic nevus: Secondary | ICD-10-CM | POA: Diagnosis not present

## 2022-03-04 DIAGNOSIS — L821 Other seborrheic keratosis: Secondary | ICD-10-CM | POA: Diagnosis not present

## 2022-03-04 DIAGNOSIS — L738 Other specified follicular disorders: Secondary | ICD-10-CM

## 2022-03-04 DIAGNOSIS — L918 Other hypertrophic disorders of the skin: Secondary | ICD-10-CM | POA: Diagnosis not present

## 2022-03-22 ENCOUNTER — Encounter: Payer: Self-pay | Admitting: Dermatology

## 2022-03-23 ENCOUNTER — Other Ambulatory Visit (HOSPITAL_COMMUNITY): Payer: Self-pay

## 2022-03-25 ENCOUNTER — Other Ambulatory Visit (HOSPITAL_COMMUNITY): Payer: Self-pay

## 2022-03-25 MED ORDER — LEVOTHYROXINE SODIUM 112 MCG PO TABS
112.0000 ug | ORAL_TABLET | Freq: Every day | ORAL | 4 refills | Status: DC
  Filled 2022-03-25 – 2022-04-10 (×2): qty 90, 90d supply, fill #0
  Filled 2022-07-09: qty 90, 90d supply, fill #1

## 2022-04-05 ENCOUNTER — Other Ambulatory Visit (HOSPITAL_COMMUNITY): Payer: Self-pay

## 2022-04-10 ENCOUNTER — Other Ambulatory Visit (HOSPITAL_COMMUNITY): Payer: Self-pay

## 2022-07-09 ENCOUNTER — Other Ambulatory Visit (HOSPITAL_COMMUNITY): Payer: Self-pay

## 2022-08-27 ENCOUNTER — Other Ambulatory Visit: Payer: Self-pay | Admitting: Family Medicine

## 2022-08-27 DIAGNOSIS — E063 Autoimmune thyroiditis: Secondary | ICD-10-CM | POA: Diagnosis not present

## 2022-08-27 DIAGNOSIS — Z13220 Encounter for screening for lipoid disorders: Secondary | ICD-10-CM | POA: Diagnosis not present

## 2022-08-27 DIAGNOSIS — Z1231 Encounter for screening mammogram for malignant neoplasm of breast: Secondary | ICD-10-CM | POA: Diagnosis not present

## 2022-08-27 DIAGNOSIS — M8580 Other specified disorders of bone density and structure, unspecified site: Secondary | ICD-10-CM | POA: Diagnosis not present

## 2022-08-27 DIAGNOSIS — Z0000 Encounter for general adult medical examination without abnormal findings: Secondary | ICD-10-CM | POA: Diagnosis not present

## 2022-08-28 ENCOUNTER — Other Ambulatory Visit (HOSPITAL_COMMUNITY): Payer: Self-pay

## 2022-08-28 MED ORDER — ERGOCALCIFEROL 1.25 MG (50000 UT) PO CAPS
50000.0000 [IU] | ORAL_CAPSULE | ORAL | 0 refills | Status: DC
  Filled 2022-08-28: qty 8, 56d supply, fill #0

## 2022-09-02 ENCOUNTER — Other Ambulatory Visit (HOSPITAL_COMMUNITY): Payer: Self-pay

## 2022-09-02 MED ORDER — LEVOTHYROXINE SODIUM 100 MCG PO TABS
100.0000 ug | ORAL_TABLET | Freq: Every morning | ORAL | 0 refills | Status: DC
  Filled 2022-09-02: qty 90, 90d supply, fill #0

## 2022-09-06 ENCOUNTER — Other Ambulatory Visit (HOSPITAL_COMMUNITY): Payer: Self-pay

## 2022-10-15 DIAGNOSIS — E039 Hypothyroidism, unspecified: Secondary | ICD-10-CM | POA: Diagnosis not present

## 2022-10-29 ENCOUNTER — Ambulatory Visit
Admission: RE | Admit: 2022-10-29 | Discharge: 2022-10-29 | Disposition: A | Discharge status: Home / Self Care | Payer: Commercial Managed Care - PPO | Source: Ambulatory Visit | Attending: Family Medicine | Admitting: Family Medicine

## 2022-10-29 DIAGNOSIS — Z1231 Encounter for screening mammogram for malignant neoplasm of breast: Secondary | ICD-10-CM

## 2022-11-01 ENCOUNTER — Other Ambulatory Visit: Payer: Self-pay | Admitting: Family Medicine

## 2022-11-01 DIAGNOSIS — R928 Other abnormal and inconclusive findings on diagnostic imaging of breast: Secondary | ICD-10-CM

## 2022-11-04 DIAGNOSIS — M7502 Adhesive capsulitis of left shoulder: Secondary | ICD-10-CM | POA: Diagnosis not present

## 2022-11-04 DIAGNOSIS — M25512 Pain in left shoulder: Secondary | ICD-10-CM | POA: Diagnosis not present

## 2022-11-08 ENCOUNTER — Ambulatory Visit
Admission: RE | Admit: 2022-11-08 | Discharge: 2022-11-08 | Disposition: A | Discharge status: Home / Self Care | Payer: Commercial Managed Care - PPO | Source: Ambulatory Visit | Attending: Family Medicine | Admitting: Family Medicine

## 2022-11-08 DIAGNOSIS — R928 Other abnormal and inconclusive findings on diagnostic imaging of breast: Secondary | ICD-10-CM

## 2022-11-08 DIAGNOSIS — N6002 Solitary cyst of left breast: Secondary | ICD-10-CM | POA: Diagnosis not present

## 2022-11-08 DIAGNOSIS — R922 Inconclusive mammogram: Secondary | ICD-10-CM | POA: Diagnosis not present

## 2022-11-19 DIAGNOSIS — M7502 Adhesive capsulitis of left shoulder: Secondary | ICD-10-CM | POA: Diagnosis not present

## 2022-12-02 DIAGNOSIS — E559 Vitamin D deficiency, unspecified: Secondary | ICD-10-CM | POA: Diagnosis not present

## 2022-12-03 DIAGNOSIS — M7502 Adhesive capsulitis of left shoulder: Secondary | ICD-10-CM | POA: Diagnosis not present

## 2022-12-16 DIAGNOSIS — M7502 Adhesive capsulitis of left shoulder: Secondary | ICD-10-CM | POA: Diagnosis not present

## 2023-01-06 ENCOUNTER — Other Ambulatory Visit (HOSPITAL_COMMUNITY): Payer: Self-pay

## 2023-01-08 ENCOUNTER — Other Ambulatory Visit (HOSPITAL_COMMUNITY): Payer: Self-pay

## 2023-01-08 MED ORDER — LEVOTHYROXINE SODIUM 100 MCG PO TABS
100.0000 ug | ORAL_TABLET | Freq: Every morning | ORAL | 2 refills | Status: DC
  Filled 2023-01-08: qty 90, 90d supply, fill #0
  Filled 2023-04-03: qty 90, 90d supply, fill #1
  Filled 2023-07-09: qty 90, 90d supply, fill #2

## 2023-01-10 ENCOUNTER — Other Ambulatory Visit (HOSPITAL_COMMUNITY): Payer: Self-pay

## 2023-07-09 ENCOUNTER — Other Ambulatory Visit (HOSPITAL_COMMUNITY): Payer: Self-pay

## 2023-09-02 DIAGNOSIS — Z0000 Encounter for general adult medical examination without abnormal findings: Secondary | ICD-10-CM | POA: Diagnosis not present

## 2023-09-02 DIAGNOSIS — E559 Vitamin D deficiency, unspecified: Secondary | ICD-10-CM | POA: Diagnosis not present

## 2023-09-02 DIAGNOSIS — E039 Hypothyroidism, unspecified: Secondary | ICD-10-CM | POA: Diagnosis not present

## 2023-09-02 DIAGNOSIS — Z13220 Encounter for screening for lipoid disorders: Secondary | ICD-10-CM | POA: Diagnosis not present

## 2023-09-02 DIAGNOSIS — M8580 Other specified disorders of bone density and structure, unspecified site: Secondary | ICD-10-CM | POA: Diagnosis not present

## 2023-09-04 ENCOUNTER — Other Ambulatory Visit: Payer: Self-pay | Admitting: Family Medicine

## 2023-09-04 DIAGNOSIS — M8580 Other specified disorders of bone density and structure, unspecified site: Secondary | ICD-10-CM

## 2023-09-30 ENCOUNTER — Other Ambulatory Visit (HOSPITAL_COMMUNITY): Payer: Self-pay

## 2023-10-02 ENCOUNTER — Other Ambulatory Visit (HOSPITAL_COMMUNITY): Payer: Self-pay

## 2023-10-02 MED ORDER — LEVOTHYROXINE SODIUM 100 MCG PO TABS
100.0000 ug | ORAL_TABLET | Freq: Every morning | ORAL | 2 refills | Status: DC
  Filled 2023-10-02: qty 90, 90d supply, fill #0
  Filled 2024-01-02: qty 90, 90d supply, fill #1
  Filled 2024-04-02: qty 90, 90d supply, fill #2

## 2023-11-18 ENCOUNTER — Encounter: Payer: Self-pay | Admitting: Dermatology

## 2023-11-18 ENCOUNTER — Ambulatory Visit: Payer: Commercial Managed Care - PPO | Admitting: Dermatology

## 2023-11-18 VITALS — BP 103/73

## 2023-11-18 DIAGNOSIS — L821 Other seborrheic keratosis: Secondary | ICD-10-CM

## 2023-11-18 DIAGNOSIS — D1801 Hemangioma of skin and subcutaneous tissue: Secondary | ICD-10-CM | POA: Diagnosis not present

## 2023-11-18 DIAGNOSIS — L820 Inflamed seborrheic keratosis: Secondary | ICD-10-CM | POA: Diagnosis not present

## 2023-11-18 DIAGNOSIS — L570 Actinic keratosis: Secondary | ICD-10-CM

## 2023-11-18 DIAGNOSIS — W908XXA Exposure to other nonionizing radiation, initial encounter: Secondary | ICD-10-CM

## 2023-11-18 DIAGNOSIS — L814 Other melanin hyperpigmentation: Secondary | ICD-10-CM | POA: Diagnosis not present

## 2023-11-18 DIAGNOSIS — D229 Melanocytic nevi, unspecified: Secondary | ICD-10-CM

## 2023-11-18 DIAGNOSIS — B079 Viral wart, unspecified: Secondary | ICD-10-CM

## 2023-11-18 DIAGNOSIS — L578 Other skin changes due to chronic exposure to nonionizing radiation: Secondary | ICD-10-CM | POA: Diagnosis not present

## 2023-11-18 DIAGNOSIS — Z1283 Encounter for screening for malignant neoplasm of skin: Secondary | ICD-10-CM | POA: Diagnosis not present

## 2023-11-18 NOTE — Progress Notes (Signed)
 New Patient Visit   Subjective  [name] Brooke Dare is a 63 y.o. female who presents for the following: Skin Cancer Screening and Full Body Skin Exam - No history of skin cancer and no family history of Melanoma. She has a spot on her left jaw line that Dr Jorja Loa treated with LN2 that seems to be coming back and a spot on her left clavicle that she recently noticed.  The patient presents for Total-Body Skin Exam (TBSE) for skin cancer screening and mole check. The patient has spots, moles and lesions to be evaluated, some may be new or changing and the patient may have concern these could be cancer.    The following portions of the chart were reviewed this encounter and updated as appropriate: medications, allergies, medical history  Review of Systems:  No other skin or systemic complaints except as noted in HPI or Assessment and Plan.  Objective  Well appearing patient in no apparent distress; mood and affect are within normal limits.  A full examination was performed including scalp, head, eyes, ears, nose, lips, neck, chest, axillae, abdomen, back, buttocks, bilateral upper extremities, bilateral lower extremities, hands, feet, fingers, toes, fingernails, and toenails. All findings within normal limits unless otherwise noted below.   Relevant physical exam findings are noted in the Assessment and Plan.  Left mandible x 1, left temple x 3, chest x 1 (5) Erythematous thin papules/macules with gritty scale.  Right Upper Eyelid Verrucous papules  Chest (6) Erythematous stuck-on, waxy papule or plaque  Assessment & Plan   SKIN CANCER SCREENING PERFORMED TODAY.  ACTINIC DAMAGE - Chronic condition, secondary to cumulative UV/sun exposure - diffuse scaly erythematous macules with underlying dyspigmentation - Recommend daily broad spectrum sunscreen SPF 30+ to sun-exposed areas, reapply every 2 hours as needed.  - Staying in the shade or wearing long sleeves, sun glasses (UVA+UVB  protection) and wide brim hats (4-inch brim around the entire circumference of the hat) are also recommended for sun protection.  - Call for new or changing lesions.  LENTIGINES, SEBORRHEIC KERATOSES, HEMANGIOMAS - Benign normal skin lesions - Benign-appearing - Call for any changes  MELANOCYTIC NEVI - Tan-brown and/or pink-flesh-colored symmetric macules and papules - Benign appearing on exam today - Observation - Call clinic for new or changing moles - Recommend daily use of broad spectrum spf 30+ sunscreen to sun-exposed areas.        AK (ACTINIC KERATOSIS) (5) Left mandible x 1, left temple x 3, chest x 1 (5) Destruction of lesion - Left mandible x 1, left temple x 3, chest x 1 (5) Complexity: simple   Destruction method: cryotherapy   Informed consent: discussed and consent obtained   Timeout:  patient name, date of birth, surgical site, and procedure verified Lesion destroyed using liquid nitrogen: Yes   Region frozen until ice ball extended beyond lesion: Yes   Outcome: patient tolerated procedure well with no complications   Post-procedure details: wound care instructions given   VIRAL WARTS, UNSPECIFIED TYPE Right Upper Eyelid Destruction of lesion - Right Upper Eyelid Complexity: simple   Destruction method: cryotherapy   Informed consent: discussed and consent obtained   Timeout:  patient name, date of birth, surgical site, and procedure verified Lesion destroyed using liquid nitrogen: Yes   Region frozen until ice ball extended beyond lesion: Yes   Outcome: patient tolerated procedure well with no complications   Post-procedure details: wound care instructions given   INFLAMED SEBORRHEIC KERATOSIS (6) Chest (6) Symptomatic, irritating,  patient would like treated.  Benign-appearing.  Call clinic for new or changing lesions.   Destruction of lesion - Chest (6) Complexity: simple   Destruction method: cryotherapy   Informed consent: discussed and consent  obtained   Timeout:  patient name, date of birth, surgical site, and procedure verified Lesion destroyed using liquid nitrogen: Yes   Region frozen until ice ball extended beyond lesion: Yes   Outcome: patient tolerated procedure well with no complications   Post-procedure details: wound care instructions given    Return in about 6 months (around 05/17/2024) for AK follow up.  I, Joanie Coddington, CMA, am acting as scribe for [employer], DO .   Documentation: I have reviewed the above documentation for accuracy and completeness, and I agree with the above.  Langston Reusing, DO

## 2023-11-18 NOTE — Patient Instructions (Addendum)

## 2023-11-21 ENCOUNTER — Other Ambulatory Visit: Payer: Self-pay | Admitting: Medical Genetics

## 2024-03-08 ENCOUNTER — Other Ambulatory Visit (HOSPITAL_COMMUNITY)
Admission: RE | Admit: 2024-03-08 | Discharge: 2024-03-08 | Disposition: A | Discharge status: Home / Self Care | Payer: Self-pay | Source: Ambulatory Visit | Attending: Oncology | Admitting: Oncology

## 2024-03-17 LAB — GENECONNECT MOLECULAR SCREEN: Genetic Analysis Overall Interpretation: NEGATIVE

## 2024-04-05 ENCOUNTER — Other Ambulatory Visit (HOSPITAL_COMMUNITY): Payer: Self-pay

## 2024-04-26 ENCOUNTER — Other Ambulatory Visit: Payer: Commercial Managed Care - PPO

## 2024-05-18 ENCOUNTER — Ambulatory Visit: Payer: Commercial Managed Care - PPO | Admitting: Dermatology

## 2024-05-18 ENCOUNTER — Encounter: Payer: Self-pay | Admitting: Dermatology

## 2024-05-18 VITALS — BP 96/63 | HR 68

## 2024-05-18 DIAGNOSIS — L820 Inflamed seborrheic keratosis: Secondary | ICD-10-CM | POA: Diagnosis not present

## 2024-05-18 DIAGNOSIS — L570 Actinic keratosis: Secondary | ICD-10-CM

## 2024-05-18 NOTE — Patient Instructions (Signed)

## 2024-05-18 NOTE — Progress Notes (Signed)
   Follow-Up Visit   Subjective  [name] is a 63 y.o. female who presents for the following: Ak  Patient present today for follow up visit for Ak. Patient was last evaluated on 11/18/23. At this visit patient had cryo therapy completed. Patient reports sxs are better. Patient denies medication changes.  The following portions of the chart were reviewed this encounter and updated as appropriate: medications, allergies, medical history  Review of Systems:  No other skin or systemic complaints except as noted in HPI or Assessment and Plan.  Objective  Well appearing patient in no apparent distress; mood and affect are within normal limits.  A focused examination was performed of the following areas: Scalp  Relevant exam findings are noted in the Assessment and Plan.  Left Eyebrow Erythematous thin papules/macules with gritty scale.   Assessment & Plan     INFLAMED SEBORRHEIC KERATOSIS (3) Left Frontal Scalp, Mid Frontal Scalp, Right Breast Destruction of lesion - Left Frontal Scalp, Mid Frontal Scalp, Right Breast Complexity: simple   Destruction method: cryotherapy   Informed consent: discussed and consent obtained   Timeout:  patient name, date of birth, surgical site, and procedure verified Lesion destroyed using liquid nitrogen: Yes   Cryotherapy cycles:  3 Outcome: patient tolerated procedure well with no complications   Post-procedure details: wound care instructions given    AK (ACTINIC KERATOSIS) Left Eyebrow Destruction of lesion - Left Eyebrow Complexity: simple   Destruction method: cryotherapy   Informed consent: discussed and consent obtained   Timeout:  patient name, date of birth, surgical site, and procedure verified Lesion destroyed using liquid nitrogen: Yes   Cryotherapy cycles:  1 Post-procedure details: wound care instructions given     Return in about 6 months (around 11/18/2024) for TBSE.  I, Jetta Ager, am acting as Neurosurgeon for [employer], DO.  Documentation: I have reviewed the above documentation for accuracy and completeness, and I agree with the above.  Delon Lenis, DO

## 2024-06-22 ENCOUNTER — Ambulatory Visit (HOSPITAL_BASED_OUTPATIENT_CLINIC_OR_DEPARTMENT_OTHER)
Admission: RE | Admit: 2024-06-22 | Discharge: 2024-06-22 | Disposition: A | Discharge status: Home / Self Care | Source: Ambulatory Visit | Attending: Family Medicine | Admitting: Family Medicine

## 2024-06-22 DIAGNOSIS — M8589 Other specified disorders of bone density and structure, multiple sites: Secondary | ICD-10-CM | POA: Diagnosis not present

## 2024-06-22 DIAGNOSIS — M8580 Other specified disorders of bone density and structure, unspecified site: Secondary | ICD-10-CM | POA: Insufficient documentation

## 2024-06-22 DIAGNOSIS — Z780 Asymptomatic menopausal state: Secondary | ICD-10-CM | POA: Diagnosis not present

## 2024-07-05 ENCOUNTER — Other Ambulatory Visit (HOSPITAL_COMMUNITY): Payer: Self-pay

## 2024-07-06 ENCOUNTER — Other Ambulatory Visit (HOSPITAL_COMMUNITY): Payer: Self-pay

## 2024-07-07 ENCOUNTER — Other Ambulatory Visit: Payer: Self-pay

## 2024-07-07 ENCOUNTER — Other Ambulatory Visit (HOSPITAL_COMMUNITY): Payer: Self-pay

## 2024-07-07 MED ORDER — LEVOTHYROXINE SODIUM 100 MCG PO TABS
100.0000 ug | ORAL_TABLET | Freq: Every morning | ORAL | 0 refills | Status: AC
  Filled 2024-07-07: qty 90, 90d supply, fill #0

## 2024-07-27 DIAGNOSIS — H43393 Other vitreous opacities, bilateral: Secondary | ICD-10-CM | POA: Diagnosis not present

## 2024-09-13 DIAGNOSIS — Z8249 Family history of ischemic heart disease and other diseases of the circulatory system: Secondary | ICD-10-CM | POA: Diagnosis not present

## 2024-09-13 DIAGNOSIS — Z0000 Encounter for general adult medical examination without abnormal findings: Secondary | ICD-10-CM | POA: Diagnosis not present

## 2024-09-13 DIAGNOSIS — E039 Hypothyroidism, unspecified: Secondary | ICD-10-CM | POA: Diagnosis not present

## 2024-09-13 DIAGNOSIS — M8580 Other specified disorders of bone density and structure, unspecified site: Secondary | ICD-10-CM | POA: Diagnosis not present

## 2024-09-13 DIAGNOSIS — E559 Vitamin D deficiency, unspecified: Secondary | ICD-10-CM | POA: Diagnosis not present

## 2024-09-13 DIAGNOSIS — Z13220 Encounter for screening for lipoid disorders: Secondary | ICD-10-CM | POA: Diagnosis not present

## 2024-09-14 ENCOUNTER — Other Ambulatory Visit (HOSPITAL_BASED_OUTPATIENT_CLINIC_OR_DEPARTMENT_OTHER): Payer: Self-pay | Admitting: Family Medicine

## 2024-09-14 DIAGNOSIS — Z8249 Family history of ischemic heart disease and other diseases of the circulatory system: Secondary | ICD-10-CM

## 2024-09-28 ENCOUNTER — Ambulatory Visit (HOSPITAL_BASED_OUTPATIENT_CLINIC_OR_DEPARTMENT_OTHER)
Admission: RE | Admit: 2024-09-28 | Discharge: 2024-09-28 | Disposition: A | Discharge status: Home / Self Care | Payer: Self-pay | Source: Ambulatory Visit | Attending: Family Medicine | Admitting: Family Medicine

## 2024-09-28 DIAGNOSIS — Z8249 Family history of ischemic heart disease and other diseases of the circulatory system: Secondary | ICD-10-CM | POA: Insufficient documentation

## 2024-10-03 ENCOUNTER — Other Ambulatory Visit (HOSPITAL_COMMUNITY): Payer: Self-pay

## 2024-10-05 ENCOUNTER — Other Ambulatory Visit (HOSPITAL_COMMUNITY): Payer: Self-pay

## 2024-10-05 MED ORDER — LEVOTHYROXINE SODIUM 100 MCG PO TABS
100.0000 ug | ORAL_TABLET | Freq: Every morning | ORAL | 1 refills | Status: AC
  Filled 2024-10-05: qty 30, 30d supply, fill #0
  Filled 2024-11-05: qty 90, 90d supply, fill #1

## 2024-10-06 ENCOUNTER — Other Ambulatory Visit (HOSPITAL_COMMUNITY): Payer: Self-pay

## 2024-11-05 ENCOUNTER — Other Ambulatory Visit (HOSPITAL_COMMUNITY): Payer: Self-pay

## 2024-11-05 ENCOUNTER — Other Ambulatory Visit: Payer: Self-pay

## 2024-11-16 ENCOUNTER — Ambulatory Visit: Admitting: Dermatology

## 2024-12-14 ENCOUNTER — Ambulatory Visit: Admitting: Dermatology
